# Patient Record
Sex: Male | Born: 1950 | Race: White | Hispanic: No | Marital: Married | Smoking: Never smoker
Health system: Southern US, Community
[De-identification: ages and names within clinical notes are randomized; demographics above are authoritative.]

## PROBLEM LIST (undated history)

## (undated) DIAGNOSIS — G4730 Sleep apnea, unspecified: Secondary | ICD-10-CM

## (undated) DIAGNOSIS — I10 Essential (primary) hypertension: Secondary | ICD-10-CM

## (undated) DIAGNOSIS — F32A Depression, unspecified: Secondary | ICD-10-CM

## (undated) DIAGNOSIS — I2510 Atherosclerotic heart disease of native coronary artery without angina pectoris: Secondary | ICD-10-CM

## (undated) HISTORY — PX: UVULOPALATOPHARYNGOPLASTY: SHX827

## (undated) HISTORY — DX: Depression, unspecified: F32.A

## (undated) HISTORY — DX: Essential (primary) hypertension: I10

## (undated) HISTORY — PX: OTHER SURGICAL HISTORY: SHX169

## (undated) HISTORY — DX: Atherosclerotic heart disease of native coronary artery without angina pectoris: I25.10

## (undated) HISTORY — PX: NASAL SEPTUM SURGERY: SHX37

## (undated) HISTORY — PX: NASAL POLYP SURGERY: SHX186

## (undated) HISTORY — DX: Sleep apnea, unspecified: G47.30

## (undated) HISTORY — PX: FINGER TENDON REPAIR: SHX1640

---

## 2001-03-03 ENCOUNTER — Emergency Department (HOSPITAL_COMMUNITY): Admission: EM | Admit: 2001-03-03 | Discharge: 2001-03-03 | Payer: Self-pay

## 2003-08-27 ENCOUNTER — Emergency Department (HOSPITAL_COMMUNITY): Admission: EM | Admit: 2003-08-27 | Discharge: 2003-08-28 | Payer: Self-pay | Admitting: Emergency Medicine

## 2012-12-24 ENCOUNTER — Encounter: Payer: Self-pay | Admitting: Gastroenterology

## 2014-03-19 ENCOUNTER — Encounter: Payer: Self-pay | Admitting: Gastroenterology

## 2020-12-07 ENCOUNTER — Encounter: Payer: Self-pay | Admitting: Cardiology

## 2020-12-07 ENCOUNTER — Ambulatory Visit: Payer: Medicare Other | Admitting: Cardiology

## 2020-12-07 ENCOUNTER — Other Ambulatory Visit: Payer: Self-pay

## 2020-12-07 VITALS — BP 142/78 | HR 80 | Temp 97.8°F | Resp 16 | Ht 73.0 in | Wt 219.0 lb

## 2020-12-07 DIAGNOSIS — I10 Essential (primary) hypertension: Secondary | ICD-10-CM

## 2020-12-07 DIAGNOSIS — R072 Precordial pain: Secondary | ICD-10-CM

## 2020-12-07 DIAGNOSIS — G4733 Obstructive sleep apnea (adult) (pediatric): Secondary | ICD-10-CM

## 2020-12-07 NOTE — Progress Notes (Signed)
Date:  12/07/2020   ID:  Richard Lindsey, DOB Mar 28, 1951, MRN [id number]  PCP:  Pieter Partridge, PA  Cardiologist:  Rex Kras, DO, [facility] (established care 12/07/2020)  REASON FOR CONSULT: Chest pain  REQUESTING PHYSICIAN:  Pieter Partridge, [address] [place],  [place] [address]  Chief Complaint  Patient presents with  . Chest Pain  . New Patient (Initial Visit)    HPI  Richard Lindsey is a 70 y.o. male who presents to the office with a chief complaint of " chest pain." Patient's past medical history and cardiovascular risk factors include: Hypertension, depression, OSA on CPAP, depression.  He is referred to the office at the request of Pieter Partridge, PA for evaluation of chest pain.  Patient states that he has had chest discomfort for a long time but it is present intermittently.  The discomfort in his located left anterior chest wall and the left axillary region.  Feels like a dull-like sensation, intensity is 2 out of 10, lasts for a few minutes, and self resolved.  The symptoms usually improve with resting and calming down and gets worse with getting upset or increased stress with day-to-day activities.  He has not noticed any change in physical endurance.  Symptoms are not brought on by effort related activities and does not resolve with rest.  Patient states that he is physically active and gets at least 8,000-9,000 steps per day but does not participate in any exercise program.  No change in physical endurance.  Dad had MI in mid 104s and another MI a year later.   FUNCTIONAL STATUS: Walks about 8,000-9,000 steps per day at work. Otherwise, no structured exercise program.  ALLERGIES: No Known Allergies  MEDICATION LIST PRIOR TO VISIT: Current Meds  Medication Sig  . amLODipine (NORVASC) 5 MG tablet Take 1 tablet by mouth daily.  Marland Kitchen azelastine (ASTELIN) 0.1 % nasal spray Place into the nose.  Marland Kitchen buPROPion (WELLBUTRIN XL) 300 MG 24 hr tablet Take 1 tablet by  mouth daily.  . hydrochlorothiazide (HYDRODIURIL) 25 MG tablet Take 1 tablet by mouth daily.     PAST MEDICAL HISTORY: Past Medical History:  Diagnosis Date  . Depression   . Hypertension   . Sleep apnea     PAST SURGICAL HISTORY: Past Surgical History:  Procedure Laterality Date  . FINGER TENDON REPAIR    . NASAL POLYP SURGERY    . NASAL SEPTUM SURGERY    . UVULOPALATOPHARYNGOPLASTY      FAMILY HISTORY: The patient family history includes Arthritis in his brother; Congestive Heart Failure in his father; Heart attack in his father; Lymphoma in his brother; Stroke in his mother.  SOCIAL HISTORY:  The patient  reports that he has never smoked. He has never used smokeless tobacco. He reports current alcohol use of about 2.0 standard drinks of alcohol per week. He reports that he does not use drugs.  REVIEW OF SYSTEMS: Review of Systems  Constitutional: Negative for chills and fever.  HENT: Negative for hoarse voice and nosebleeds.   Eyes: Negative for discharge, double vision and pain.  Cardiovascular: Positive for chest pain. Negative for claudication, dyspnea on exertion, leg swelling, near-syncope, orthopnea, palpitations, paroxysmal nocturnal dyspnea and syncope.  Respiratory: Negative for hemoptysis and shortness of breath.   Musculoskeletal: Negative for muscle cramps and myalgias.  Gastrointestinal: Negative for abdominal pain, constipation, diarrhea, hematemesis, hematochezia, melena, nausea and vomiting.  Neurological: Negative for dizziness and light-headedness.  PHYSICAL EXAM: Today's Vitals   12/07/20 0930 12/07/20 0940  BP: (!) 176/99 (!) 142/78  Pulse: 82 80  Resp: 16   Temp: 97.8 F (36.6 C)   TempSrc: Temporal   SpO2: 96%   Weight: 219 lb (99.3 kg)   Height: $Remove'6\' 1"'wItwGRc$  (1.854 m)    Body mass index is 28.89 kg/m. CONSTITUTIONAL: Well-developed and well-nourished. No acute distress.  SKIN: Skin is warm and dry. No rash noted. No cyanosis. No pallor. No  jaundice HEAD: Normocephalic and atraumatic.  EYES: No scleral icterus MOUTH/THROAT: Moist oral membranes.  NECK: No JVD present. No thyromegaly noted. No carotid bruits  LYMPHATIC: No visible cervical adenopathy.  CHEST Normal respiratory effort. No intercostal retractions  LUNGS: Clear to auscultation bilaterally.  No stridor. No wheezes. No rales.  CARDIOVASCULAR: Regular rate and rhythm, positive S1-S2, no murmurs rubs or gallops appreciated. ABDOMINAL: No apparent ascites.  EXTREMITIES: No peripheral edema  HEMATOLOGIC: No significant bruising NEUROLOGIC: Oriented to person, place, and time. Nonfocal. Normal muscle tone.  PSYCHIATRIC: Normal mood and affect. Normal behavior. Cooperative  CARDIAC DATABASE: EKG: 12/07/2020: Normal sinus rhythm, left axis deviation, left anterior fascicular block, without underlying injury pattern.   Echocardiogram: No results found for this or any previous visit from the past 1095 days.   Stress Testing: No results found for this or any previous visit from the past 1095 days.  Heart Catheterization: None  LABORATORY DATA: No flowsheet data found.  No flowsheet data found.  Lipid Panel  No results found for: CHOL, TRIG, HDL, CHOLHDL, VLDL, LDLCALC, LDLDIRECT, LABVLDL  No components found for: NTPROBNP No results for input(s): PROBNP in the last 8760 hours. No results for input(s): TSH in the last 8760 hours.  BMP No results for input(s): NA, K, CL, CO2, GLUCOSE, BUN, CREATININE, CALCIUM, GFRNONAA, GFRAA in the last 8760 hours.  HEMOGLOBIN A1C No results found for: HGBA1C, MPG  External Labs: Collected: 11/16/2020 provided by PCP. Sodium 136, potassium 3.4, chloride 98, bicarb 30, BUN 17 Creatinine 1.21 mg/dL. eGFR: >60 mL/min per 1.73 m Hemoglobin 15.5 g/dL. Hematocrit 46%  IMPRESSION:    ICD-10-CM   1. Precordial pain  R07.2 EKG 12-Lead    PCV CARDIAC STRESS TEST    PCV ECHOCARDIOGRAM COMPLETE    CT CARDIAC SCORING (SELF  PAY ONLY)    Lipid Panel With LDL/HDL Ratio    LDL cholesterol, direct  2. Benign hypertension  I10   3. OSA on CPAP  G47.33    Z99.89      RECOMMENDATIONS: Richard Lindsey is a 70 y.o. male whose past medical history and cardiac risk factors include: Hypertension, depression, OSA on CPAP, depression.  Precordial pain: Most likely noncardiac in etiology. However, due to multiple cardiovascular risk factors that shared decision was to proceed with an ischemic evaluation. Schedule exercise treadmill stress test to evaluate for functional status and exercises induced ischemia Echocardiogram will be ordered to evaluate for structural heart disease and left ventricular systolic function. Coronary artery calcium score for further risk stratification Will check fasting lipid profile.  Benign essential hypertension: Blood pressure within acceptable range but not at goal. Currently working with PCP for medication titration. Patient states that he was recently started on Norvasc. Low-salt diet recommended.  Obstructive sleep apnea: Uses CPAP daily.   FINAL MEDICATION LIST END OF ENCOUNTER: No orders of the defined types were placed in this encounter.   There are no discontinued medications.   Current Outpatient Medications:  .  amLODipine (NORVASC) 5  MG tablet, Take 1 tablet by mouth daily., Disp: , Rfl:  .  azelastine (ASTELIN) 0.1 % nasal spray, Place into the nose., Disp: , Rfl:  .  buPROPion (WELLBUTRIN XL) 300 MG 24 hr tablet, Take 1 tablet by mouth daily., Disp: , Rfl:  .  hydrochlorothiazide (HYDRODIURIL) 25 MG tablet, Take 1 tablet by mouth daily., Disp: , Rfl:   Orders Placed This Encounter  Procedures  . CT CARDIAC SCORING (SELF PAY ONLY)  . Lipid Panel With LDL/HDL Ratio  . LDL cholesterol, direct  . PCV CARDIAC STRESS TEST  . EKG 12-Lead  . PCV ECHOCARDIOGRAM COMPLETE    There are no Patient Instructions on file for this visit.   --Continue cardiac medications as  reconciled in final medication list. --Return in about 6 weeks (around 01/18/2021) for Follow up, Chest pain, Review test results. Or sooner if needed. --Continue follow-up with your primary care physician regarding the management of your other chronic comorbid conditions.  Patient's questions and concerns were addressed to his satisfaction. He voices understanding of the instructions provided during this encounter.   This note was created using a voice recognition software as a result there may be grammatical errors inadvertently enclosed that do not reflect the nature of this encounter. Every attempt is made to correct such errors.  Rex Kras, [place], [facility]  Pager: [phone] Office: [phone]

## 2020-12-15 ENCOUNTER — Other Ambulatory Visit (HOSPITAL_COMMUNITY)
Admission: RE | Admit: 2020-12-15 | Discharge: 2020-12-15 | Disposition: A | Discharge status: Home / Self Care | Payer: Medicare Other | Source: Ambulatory Visit | Attending: Cardiology | Admitting: Cardiology

## 2020-12-15 DIAGNOSIS — Z01812 Encounter for preprocedural laboratory examination: Secondary | ICD-10-CM | POA: Insufficient documentation

## 2020-12-15 DIAGNOSIS — Z20822 Contact with and (suspected) exposure to covid-19: Secondary | ICD-10-CM | POA: Insufficient documentation

## 2020-12-15 LAB — SARS CORONAVIRUS 2 (TAT 6-24 HRS): SARS Coronavirus 2: NEGATIVE

## 2020-12-17 ENCOUNTER — Other Ambulatory Visit: Payer: Self-pay

## 2020-12-17 ENCOUNTER — Ambulatory Visit: Payer: Medicare Other

## 2020-12-17 DIAGNOSIS — R072 Precordial pain: Secondary | ICD-10-CM

## 2020-12-20 ENCOUNTER — Ambulatory Visit
Admission: RE | Admit: 2020-12-20 | Discharge: 2020-12-20 | Disposition: A | Discharge status: Home / Self Care | Payer: No Typology Code available for payment source | Source: Ambulatory Visit | Attending: Cardiology | Admitting: Cardiology

## 2020-12-20 ENCOUNTER — Ambulatory Visit: Payer: Medicare Other

## 2020-12-20 ENCOUNTER — Other Ambulatory Visit: Payer: Self-pay

## 2020-12-20 DIAGNOSIS — R072 Precordial pain: Secondary | ICD-10-CM

## 2020-12-28 NOTE — Telephone Encounter (Signed)
Please reach out to radiology and have them amend the report and findings.

## 2020-12-28 NOTE — Telephone Encounter (Signed)
Please follow up in 48hr to make sure the changes have been made.

## 2021-01-19 LAB — LIPID PANEL WITH LDL/HDL RATIO
Cholesterol, Total: 180 mg/dL (ref 100–199)
HDL: 58 mg/dL (ref >39)
LDL Chol Calc (NIH): 113 mg/dL — ABNORMAL HIGH (ref 0–99)
LDL/HDL Ratio: 1.9 ratio (ref 0.0–3.6)
Triglycerides: 48 mg/dL (ref 0–149)
VLDL Cholesterol Cal: 9 mg/dL (ref 5–40)

## 2021-01-19 LAB — LDL CHOLESTEROL, DIRECT: LDL Direct: 113 mg/dL — ABNORMAL HIGH (ref 0–99)

## 2021-01-25 ENCOUNTER — Encounter: Payer: Self-pay | Admitting: Cardiology

## 2021-01-25 ENCOUNTER — Other Ambulatory Visit: Payer: Self-pay

## 2021-01-25 ENCOUNTER — Ambulatory Visit: Payer: Medicare Other | Admitting: Cardiology

## 2021-01-25 VITALS — BP 144/81 | HR 81 | Temp 97.7°F | Resp 16 | Ht 73.0 in | Wt 220.0 lb

## 2021-01-25 DIAGNOSIS — I2510 Atherosclerotic heart disease of native coronary artery without angina pectoris: Secondary | ICD-10-CM

## 2021-01-25 DIAGNOSIS — G4733 Obstructive sleep apnea (adult) (pediatric): Secondary | ICD-10-CM

## 2021-01-25 DIAGNOSIS — R072 Precordial pain: Secondary | ICD-10-CM

## 2021-01-25 DIAGNOSIS — I10 Essential (primary) hypertension: Secondary | ICD-10-CM

## 2021-01-25 MED ORDER — ROSUVASTATIN CALCIUM 5 MG PO TABS: 5.0000 mg | ORAL_TABLET | Freq: Every day | ORAL | 0 refills | Status: DC

## 2021-01-25 MED ORDER — ASPIRIN EC 81 MG PO TBEC: 81.0000 mg | DELAYED_RELEASE_TABLET | Freq: Every day | ORAL | 3 refills | Status: DC

## 2021-01-25 NOTE — Progress Notes (Signed)
Date:  01/25/2021   ID:  Richard Lindsey, DOB 20-Jan-1951, MRN [id number]  PCP:  Pieter Partridge, PA  Cardiologist:  Rex Kras, DO, [facility] (established care 12/07/2020)  Date: 01/25/21 Last Office Visit: 12/07/2020  Chief Complaint  Patient presents with  . Precordial pain  . Results  . Follow-up    HPI  Richard Lindsey is a 70 y.o. male who presents to the office with a chief complaint of " re-evaluation of chest pain and review test results." Patient's past medical history and cardiovascular risk factors include: Mild coronary artery calcification, Hypertension, depression, OSA on CPAP, depression.  He is referred to the office at the request of Pieter Partridge, PA for evaluation of chest pain.  Since the last office visit he has been asymptomatic with regards to chest pain or anginal equivalent.   Reviewed the results of echo, calcium score, and exercise treadmill stress test with him at today's visit and noted below for reference.   He continues to walk atleast  8,000-9,000 steps per day and no change in physical endurance.  Dad had MI in mid 60s and another MI a year later.   FUNCTIONAL STATUS: Walks about 8,000-9,000 steps per day at work. Otherwise, no structured exercise program.  ALLERGIES: No Known Allergies  MEDICATION LIST PRIOR TO VISIT: Current Meds  Medication Sig  . amLODipine (NORVASC) 5 MG tablet Take 1 tablet by mouth daily.  Marland Kitchen aspirin EC 81 MG tablet Take 1 tablet (81 mg total) by mouth daily. Swallow whole.  Marland Kitchen azelastine (ASTELIN) 0.1 % nasal spray Place into the nose.  Marland Kitchen buPROPion (WELLBUTRIN XL) 300 MG 24 hr tablet Take 1 tablet by mouth daily.  . hydrochlorothiazide (HYDRODIURIL) 25 MG tablet Take 1 tablet by mouth daily.  . rosuvastatin (CRESTOR) 5 MG tablet Take 1 tablet (5 mg total) by mouth at bedtime.     PAST MEDICAL HISTORY: Past Medical History:  Diagnosis Date  . Depression   . Hypertension   . Sleep apnea     PAST SURGICAL  HISTORY: Past Surgical History:  Procedure Laterality Date  . FINGER TENDON REPAIR    . NASAL POLYP SURGERY    . NASAL SEPTUM SURGERY    . UVULOPALATOPHARYNGOPLASTY      FAMILY HISTORY: The patient family history includes Arthritis in his brother; Congestive Heart Failure in his father; Heart attack in his father; Lymphoma in his brother; Stroke in his mother.  SOCIAL HISTORY:  The patient  reports that he has never smoked. He has never used smokeless tobacco. He reports current alcohol use of about 2.0 standard drinks of alcohol per week. He reports that he does not use drugs.  REVIEW OF SYSTEMS: Review of Systems  Constitutional: Negative for chills and fever.  HENT: Negative for hoarse voice and nosebleeds.   Eyes: Negative for discharge, double vision and pain.  Cardiovascular: Negative for chest pain, claudication, dyspnea on exertion, leg swelling, near-syncope, orthopnea, palpitations, paroxysmal nocturnal dyspnea and syncope.  Respiratory: Negative for hemoptysis and shortness of breath.   Musculoskeletal: Negative for muscle cramps and myalgias.  Gastrointestinal: Negative for abdominal pain, constipation, diarrhea, hematemesis, hematochezia, melena, nausea and vomiting.  Neurological: Negative for dizziness and light-headedness.    PHYSICAL EXAM: Today's Vitals   01/25/21 1030  BP: (!) 144/81  Pulse: 81  Resp: 16  Temp: 97.7 F (36.5 C)  TempSrc: Temporal  SpO2: 98%  Weight: 220 lb (99.8 kg)  Height: 6' 1"  (1.854 m)  Body mass index is 29.03 kg/m. CONSTITUTIONAL: Well-developed and well-nourished. No acute distress.  SKIN: Skin is warm and dry. No rash noted. No cyanosis. No pallor. No jaundice HEAD: Normocephalic and atraumatic.  EYES: No scleral icterus MOUTH/THROAT: Moist oral membranes.  NECK: No JVD present. No thyromegaly noted. No carotid bruits  LYMPHATIC: No visible cervical adenopathy.  CHEST Normal respiratory effort. No intercostal retractions   LUNGS: Clear to auscultation bilaterally.  No stridor. No wheezes. No rales.  CARDIOVASCULAR: Regular rate and rhythm, positive S1-S2, no murmurs rubs or gallops appreciated. ABDOMINAL: No apparent ascites.  EXTREMITIES: No peripheral edema  HEMATOLOGIC: No significant bruising NEUROLOGIC: Oriented to person, place, and time. Nonfocal. Normal muscle tone.  PSYCHIATRIC: Normal mood and affect. Normal behavior. Cooperative  CARDIAC DATABASE: EKG: 12/07/2020: Normal sinus rhythm, left axis deviation, left anterior fascicular block, without underlying injury pattern.   Echocardiogram: 12/20/2020: Normal LV systolic function with visual EF 60-65%. Left ventricle cavity is normal in size. Moderate left ventricular hypertrophy. Normal global wall motion. Normal diastolic filling pattern, normal LAP. Trace aortic regurgitation. Trace tricuspid regurgitation. No evidence of pulmonary hypertension. No prior study for comparison   Stress Testing: Exercise treadmill stress test 12/17/2020: Exercise treadmill stress test performed using Bruce protocol. Patient reached 10.1 METS, and 105% of age predicted maximum heart rate. Exercise capacity was excellent. No chest pain reported. Normal heart rate and hemodynamic response. Stress EKG revealed no ischemic changes. Low risk study.  Heart Catheterization: None  Coronary artery calcium scoring 12/2020: 1. LAD and LEFT circumflex coronary artery calcification. 2. Total Agatston Score: 105 3. MESA age and sex matched database percentile: 31  LABORATORY DATA: No flowsheet data found.  No flowsheet data found.  Lipid Panel     Component Value Date/Time   CHOL 180 01/18/2021 0842   TRIG 48 01/18/2021 0842   HDL 58 01/18/2021 0842   LDLCALC 113 (H) 01/18/2021 0842   LDLDIRECT 113 (H) 01/18/2021 0842   LABVLDL 9 01/18/2021 0842    No components found for: NTPROBNP No results for input(s): PROBNP in the last 8760 hours. No results for  input(s): TSH in the last 8760 hours.  BMP No results for input(s): NA, K, CL, CO2, GLUCOSE, BUN, CREATININE, CALCIUM, GFRNONAA, GFRAA in the last 8760 hours.  HEMOGLOBIN A1C No results found for: HGBA1C, MPG  External Labs: Collected: 11/16/2020 provided by PCP. Sodium 136, potassium 3.4, chloride 98, bicarb 30, BUN 17 Creatinine 1.21 mg/dL. eGFR: >60 mL/min per 1.73 m Hemoglobin 15.5 g/dL. Hematocrit 46%  IMPRESSION:    ICD-10-CM   1. Precordial pain  R07.2   2. Benign hypertension  I10   3. OSA on CPAP  G47.33    Z99.89   4. Coronary atherosclerosis due to calcified coronary lesion  I25.10 aspirin EC 81 MG tablet   I25.84 rosuvastatin (CRESTOR) 5 MG tablet    Lipid Panel With LDL/HDL Ratio    LDL cholesterol, direct    CMP14+EGFR     RECOMMENDATIONS: LANDYN BUCKALEW is a 70 y.o. male whose past medical history and cardiac risk factors include: Hypertension, depression, OSA on CPAP, depression.  Precordial pain: Resolved  Reviewed the results of echo, stress test, and calcium score with the patient.  Given the mild CAC noted in both LAD and LCX recommended statin and ASA 81 mg po qday. He is agreeable.   Coronary atherosclerosis due to calcified coronary lesion:  Start Crestor 5 mg po qhs  Start ASA 79m po qday Will check lipids  and CMP in 6 weeks to re-evaluate therapy.   Benign essential hypertension: Office blood pressure not at goal but working with PCP for medication titration. Patient states that he was recently started on Norvasc - may consider uptitration if needed.  Educated to keep a BP log.  Low-salt diet recommended.  Obstructive sleep apnea: Uses CPAP daily.  As long he tolerates the medications well and repeat blood work in 6 weeks is stable will see him in follow up in 6 months or sooner if needed. He is thankful for the following and care provided.   FINAL MEDICATION LIST END OF ENCOUNTER: Meds ordered this encounter  Medications  . aspirin EC  81 MG tablet    Sig: Take 1 tablet (81 mg total) by mouth daily. Swallow whole.    Dispense:  90 tablet    Refill:  3  . rosuvastatin (CRESTOR) 5 MG tablet    Sig: Take 1 tablet (5 mg total) by mouth at bedtime.    Dispense:  90 tablet    Refill:  0    There are no discontinued medications.   Current Outpatient Medications:  .  amLODipine (NORVASC) 5 MG tablet, Take 1 tablet by mouth daily., Disp: , Rfl:  .  aspirin EC 81 MG tablet, Take 1 tablet (81 mg total) by mouth daily. Swallow whole., Disp: 90 tablet, Rfl: 3 .  azelastine (ASTELIN) 0.1 % nasal spray, Place into the nose., Disp: , Rfl:  .  buPROPion (WELLBUTRIN XL) 300 MG 24 hr tablet, Take 1 tablet by mouth daily., Disp: , Rfl:  .  hydrochlorothiazide (HYDRODIURIL) 25 MG tablet, Take 1 tablet by mouth daily., Disp: , Rfl:  .  rosuvastatin (CRESTOR) 5 MG tablet, Take 1 tablet (5 mg total) by mouth at bedtime., Disp: 90 tablet, Rfl: 0  Orders Placed This Encounter  Procedures  . Lipid Panel With LDL/HDL Ratio  . LDL cholesterol, direct  . CMP14+EGFR    There are no Patient Instructions on file for this visit.   --Continue cardiac medications as reconciled in final medication list. --Return in about 6 months (around 07/27/2021) for Follow up, Coronary artery calcification. Or sooner if needed. --Continue follow-up with your primary care physician regarding the management of your other chronic comorbid conditions.  Patient's questions and concerns were addressed to his satisfaction. He voices understanding of the instructions provided during this encounter.   This note was created using a voice recognition software as a result there may be grammatical errors inadvertently enclosed that do not reflect the nature of this encounter. Every attempt is made to correct such errors.  Rex Kras, [place], [facility]  Pager: [phone] Office: [phone]

## 2021-02-03 ENCOUNTER — Other Ambulatory Visit: Payer: Self-pay

## 2021-02-03 DIAGNOSIS — I2510 Atherosclerotic heart disease of native coronary artery without angina pectoris: Secondary | ICD-10-CM

## 2021-03-19 LAB — CMP14+EGFR
ALT: 27 IU/L (ref 0–44)
AST: 23 IU/L (ref 0–40)
Albumin/Globulin Ratio: 1.8 (ref 1.2–2.2)
Albumin: 4.5 g/dL (ref 3.8–4.8)
Alkaline Phosphatase: 79 IU/L (ref 44–121)
BUN/Creatinine Ratio: 16 (ref 10–24)
BUN: 17 mg/dL (ref 8–27)
Bilirubin Total: 0.9 mg/dL (ref 0.0–1.2)
CO2: 24 mmol/L (ref 20–29)
Calcium: 9 mg/dL (ref 8.6–10.2)
Chloride: 100 mmol/L (ref 96–106)
Creatinine, Ser: 1.09 mg/dL (ref 0.76–1.27)
Globulin, Total: 2.5 g/dL (ref 1.5–4.5)
Glucose: 95 mg/dL (ref 65–99)
Potassium: 3.8 mmol/L (ref 3.5–5.2)
Sodium: 140 mmol/L (ref 134–144)
Total Protein: 7 g/dL (ref 6.0–8.5)
eGFR: 74 mL/min/{1.73_m2} (ref >59)

## 2021-03-19 LAB — LIPID PANEL WITH LDL/HDL RATIO
Cholesterol, Total: 140 mg/dL (ref 100–199)
HDL: 58 mg/dL (ref >39)
LDL Chol Calc (NIH): 68 mg/dL (ref 0–99)
LDL/HDL Ratio: 1.2 ratio (ref 0.0–3.6)
Triglycerides: 73 mg/dL (ref 0–149)
VLDL Cholesterol Cal: 14 mg/dL (ref 5–40)

## 2021-03-19 LAB — LDL CHOLESTEROL, DIRECT: LDL Direct: 67 mg/dL (ref 0–99)

## 2021-04-22 ENCOUNTER — Other Ambulatory Visit: Payer: Self-pay | Admitting: Cardiology

## 2021-04-22 DIAGNOSIS — I2510 Atherosclerotic heart disease of native coronary artery without angina pectoris: Secondary | ICD-10-CM

## 2021-04-22 DIAGNOSIS — I2584 Coronary atherosclerosis due to calcified coronary lesion: Secondary | ICD-10-CM

## 2021-05-19 IMAGING — CT CT CARDIAC CORONARY ARTERY CALCIUM SCORE
3 series · 13 of 20 positions shown, 15 images · non-contrast
Comparison: None.
COMPARISON: None.

Addendum:
CLINICAL DATA: 79-year-old white male. Family history heart disease

EXAM:
CT CARDIAC CORONARY ARTERY CALCIUM SCORE
TECHNIQUE: Non-contrast imaging through the heart was performed using
prospective ECG gating. Image post processing was performed on an
independent workstation, allowing for quantitative analysis of the
heart and coronary arteries. Note that this exam targets the heart
and the chest was not imaged in its entirety.
CLINICAL DATA: 69-year-old.  Impression unchanged.
*** End of Addendum ***

[Series 2: calcium scoring 2.00 qr36 bestdiast 71% hrt calciu · axial · 0.45mm/px · z∈[+1467,+1531]mm · 3 of 80 slices shown]
[im 16/80  vessel]
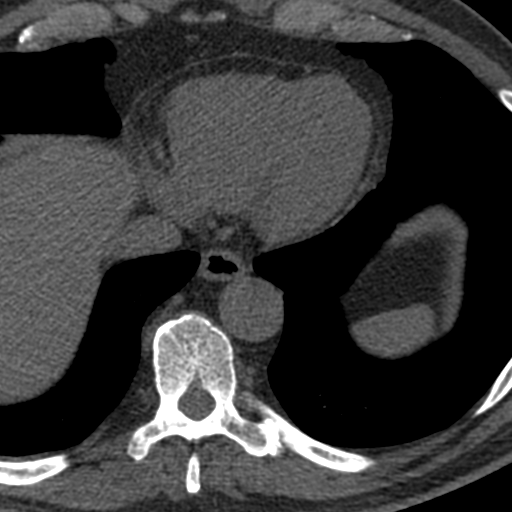
[im 32/80  vessel]
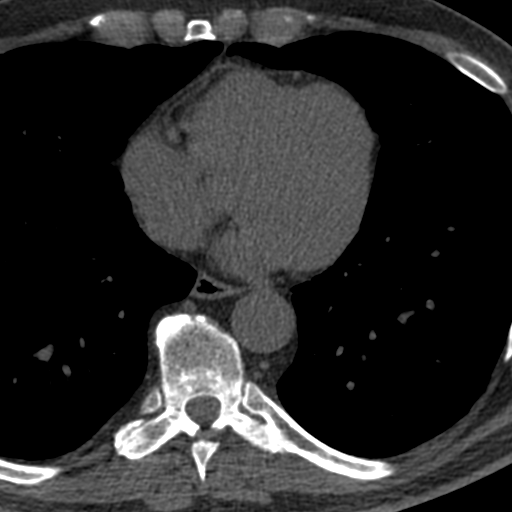
[im 48/80  vessel]
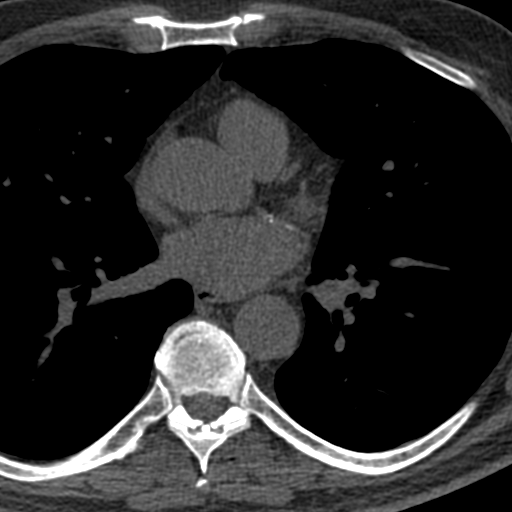

[Series 3: calcium scoring 2.00 br40 bestdiast 71% axial · axial · 0.55mm/px · z∈[+1463,+1567]mm · 5 of 80 slices shown, 7 images]
[im 14/80  vessel]
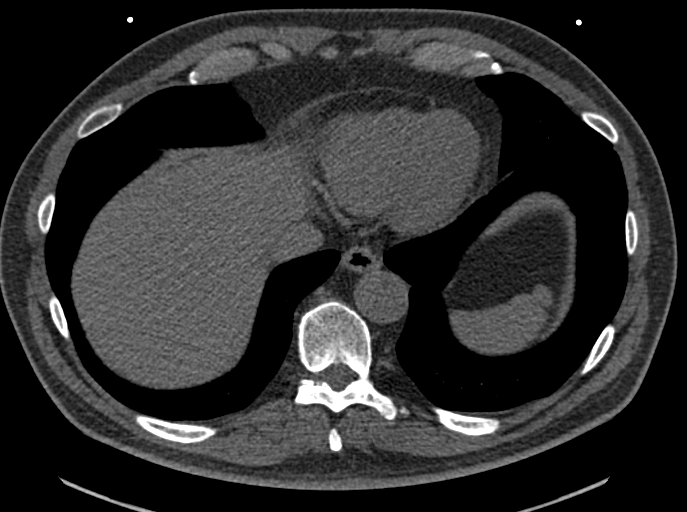
[im 14/80  lung]
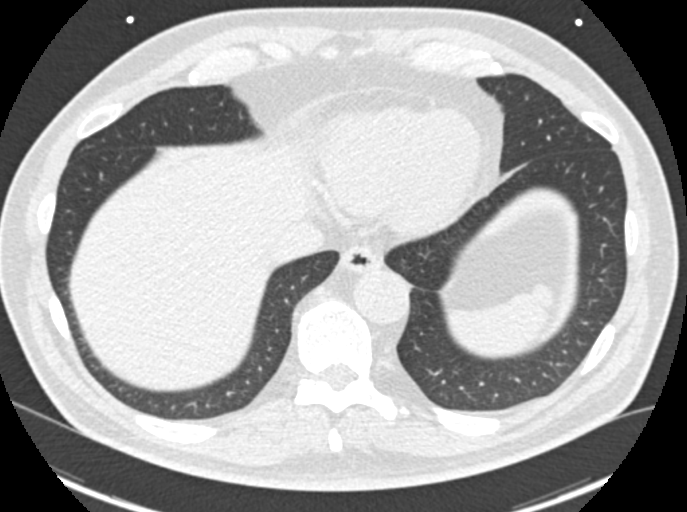
[im 27/80  vessel]
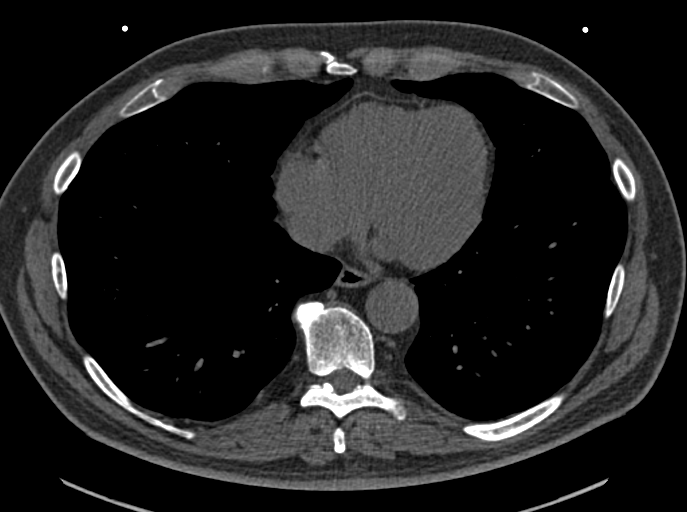
[im 40/80  vessel]
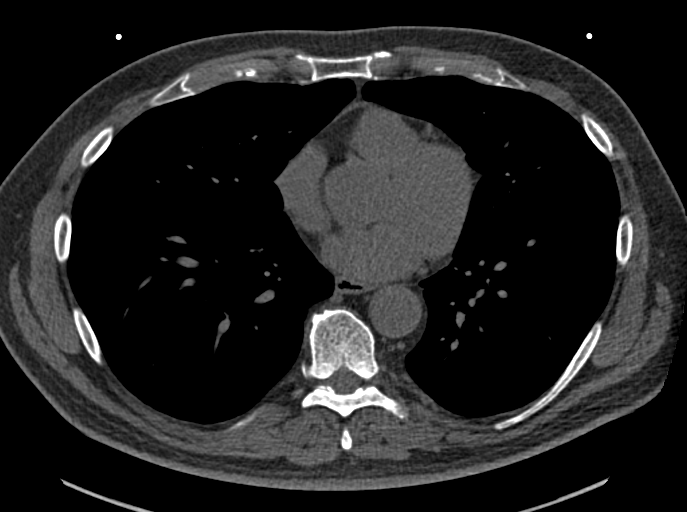
[im 53/80  vessel]
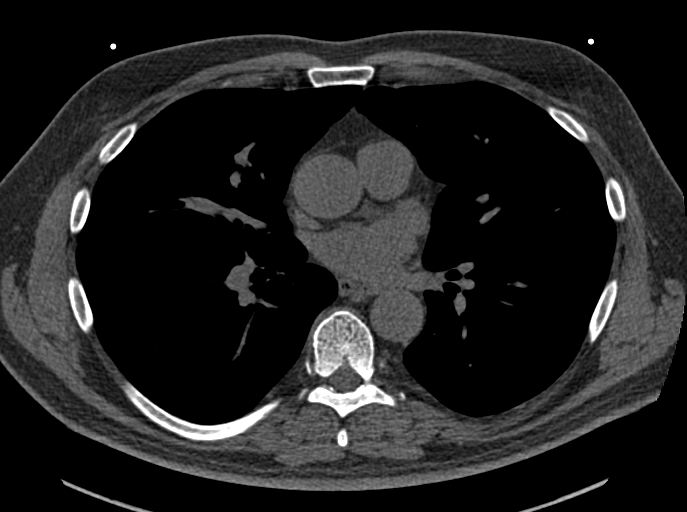
[im 66/80  vessel]
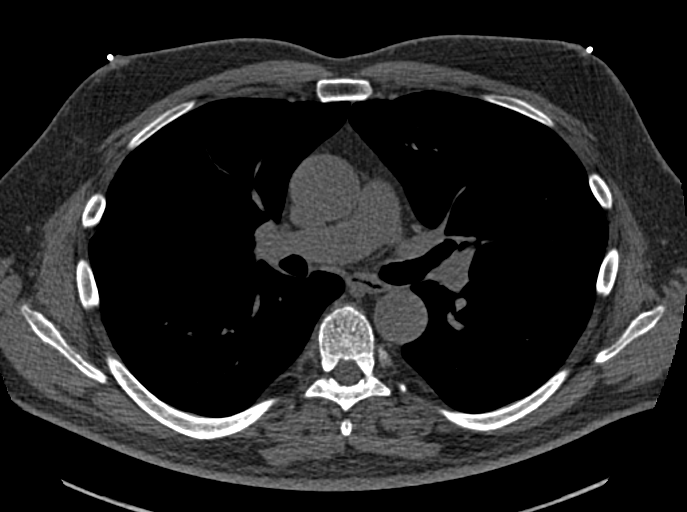
[im 66/80  lung]
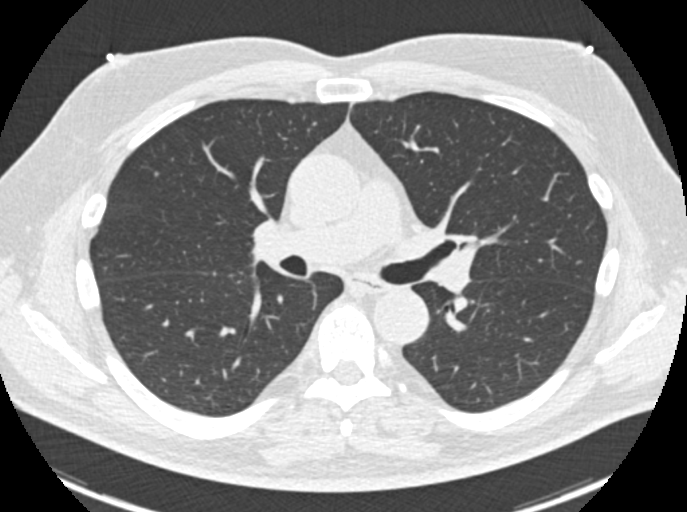

[Series 9: calcium scoring 2.00 br60 bestdiast 71% lungs · axial · 0.60mm/px · z∈[+1463,+1567]mm · 5 of 80 slices shown]
[im 14/80  vessel]
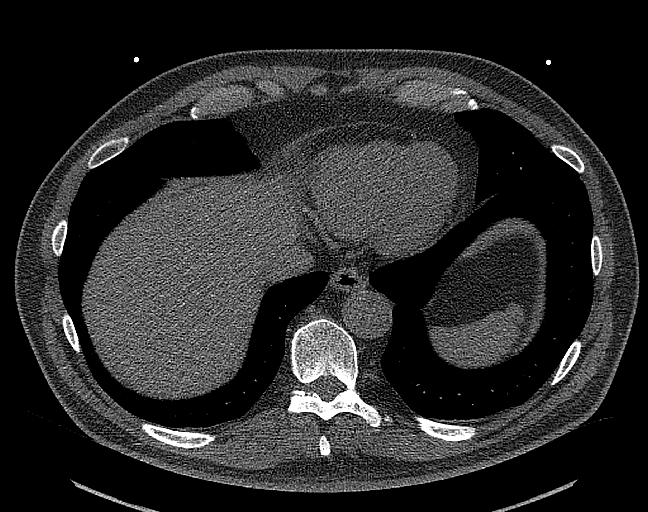
[im 27/80  vessel]
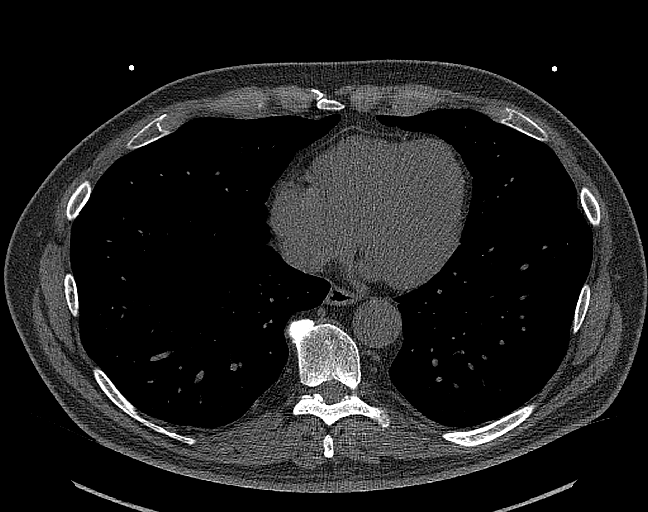
[im 40/80  vessel]
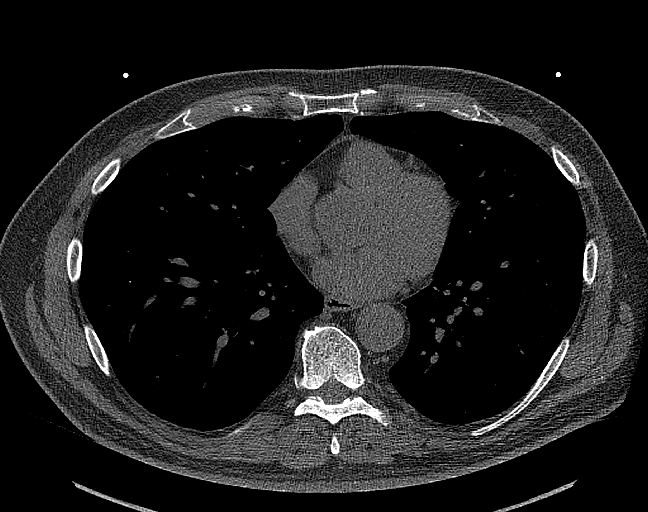
[im 53/80  vessel]
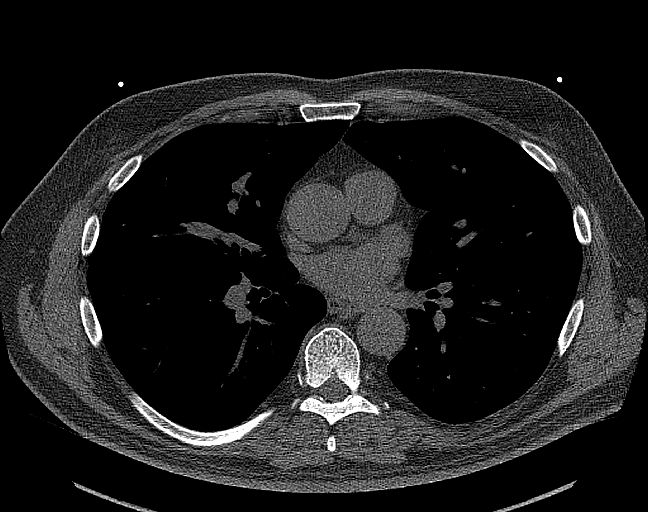
[im 66/80  vessel]
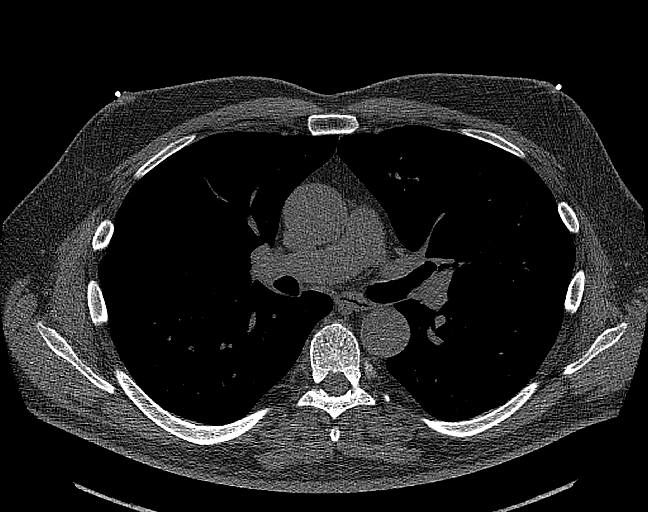

[13 of 20 positions shown; findings below may reference images not displayed]

FINDINGS: Technical quality: Good

CORONARY CALCIUM SCORES:

Left Main: No coronary artery calcification

LAD: 79

LCx: 26

RCA: No coronary calcification

CORONARY CALCIUM

Total Agatston Score: 105

[HOSPITAL] percentile: 48

Ascending aorta (normal <  40 mm): 34 mm

EXTRACARDIAC FINDINGS:

Limited view of the lung parenchyma demonstrates no suspicious
nodularity. Airways are normal.

Limited view of the mediastinum demonstrates no adenopathy.
Esophagus normal.

Limited view of the upper abdomen is unremarkable.

Limited view of the skeleton and chest wall is unremarkable.
IMPRESSION: 1. LAD and LEFT circumflex coronary artery calcification.

2. Total Agatston Score: 105

3. MESA age and sex matched database percentile: 48

ADDENDUM:
Voice recognition error:
FINDINGS: Technical quality: Good

CORONARY CALCIUM SCORES:

Left Main: No coronary artery calcification

LAD: 79

LCx: 26

RCA: No coronary calcification

CORONARY CALCIUM

Total Agatston Score: 105

[HOSPITAL] percentile: 48

Ascending aorta (normal <  40 mm): 34 mm

EXTRACARDIAC FINDINGS:

Limited view of the lung parenchyma demonstrates no suspicious
nodularity. Airways are normal.

Limited view of the mediastinum demonstrates no adenopathy.
Esophagus normal.

Limited view of the upper abdomen is unremarkable.

Limited view of the skeleton and chest wall is unremarkable.
IMPRESSION: 1. LAD and LEFT circumflex coronary artery calcification.

2. Total Agatston Score: 105

3. MESA age and sex matched database percentile: 48

## 2021-06-22 ENCOUNTER — Other Ambulatory Visit (HOSPITAL_COMMUNITY): Payer: Self-pay | Admitting: Family Medicine

## 2021-06-22 ENCOUNTER — Other Ambulatory Visit: Payer: Self-pay

## 2021-06-22 ENCOUNTER — Ambulatory Visit (HOSPITAL_COMMUNITY)
Admission: RE | Admit: 2021-06-22 | Discharge: 2021-06-22 | Disposition: A | Discharge status: Home / Self Care | Payer: Medicare Other | Source: Ambulatory Visit | Attending: Cardiology | Admitting: Cardiology

## 2021-06-22 DIAGNOSIS — M7989 Other specified soft tissue disorders: Secondary | ICD-10-CM | POA: Insufficient documentation

## 2021-06-22 DIAGNOSIS — M79605 Pain in left leg: Secondary | ICD-10-CM | POA: Diagnosis not present

## 2021-07-25 ENCOUNTER — Other Ambulatory Visit: Payer: Self-pay | Admitting: Cardiology

## 2021-07-26 LAB — CMP14+EGFR
ALT: 26 IU/L (ref 0–44)
AST: 22 IU/L (ref 0–40)
Albumin/Globulin Ratio: 1.8 (ref 1.2–2.2)
Albumin: 4.6 g/dL (ref 3.8–4.8)
Alkaline Phosphatase: 88 IU/L (ref 44–121)
BUN/Creatinine Ratio: 19 (ref 10–24)
BUN: 20 mg/dL (ref 8–27)
Bilirubin Total: 0.6 mg/dL (ref 0.0–1.2)
CO2: 26 mmol/L (ref 20–29)
Calcium: 9.5 mg/dL (ref 8.6–10.2)
Chloride: 100 mmol/L (ref 96–106)
Creatinine, Ser: 1.08 mg/dL (ref 0.76–1.27)
Globulin, Total: 2.6 g/dL (ref 1.5–4.5)
Glucose: 114 mg/dL — ABNORMAL HIGH (ref 70–99)
Potassium: 3.7 mmol/L (ref 3.5–5.2)
Sodium: 139 mmol/L (ref 134–144)
Total Protein: 7.2 g/dL (ref 6.0–8.5)
eGFR: 74 mL/min/{1.73_m2} (ref >59)

## 2021-07-26 LAB — LIPID PANEL WITH LDL/HDL RATIO
Cholesterol, Total: 136 mg/dL (ref 100–199)
HDL: 64 mg/dL (ref >39)
LDL Chol Calc (NIH): 55 mg/dL (ref 0–99)
LDL/HDL Ratio: 0.9 ratio (ref 0.0–3.6)
Triglycerides: 88 mg/dL (ref 0–149)
VLDL Cholesterol Cal: 17 mg/dL (ref 5–40)

## 2021-07-26 LAB — LDL CHOLESTEROL, DIRECT: LDL Direct: 56 mg/dL (ref 0–99)

## 2021-07-27 ENCOUNTER — Encounter: Payer: Self-pay | Admitting: Cardiology

## 2021-07-27 ENCOUNTER — Ambulatory Visit: Payer: Medicare Other | Admitting: Cardiology

## 2021-07-27 ENCOUNTER — Other Ambulatory Visit: Payer: Self-pay

## 2021-07-27 VITALS — BP 165/85 | HR 71 | Resp 16 | Ht 73.0 in | Wt 222.0 lb

## 2021-07-27 DIAGNOSIS — I2510 Atherosclerotic heart disease of native coronary artery without angina pectoris: Secondary | ICD-10-CM

## 2021-07-27 DIAGNOSIS — I10 Essential (primary) hypertension: Secondary | ICD-10-CM

## 2021-07-27 DIAGNOSIS — G4733 Obstructive sleep apnea (adult) (pediatric): Secondary | ICD-10-CM

## 2021-07-27 NOTE — Progress Notes (Signed)
Date:  01/25/2021   ID:  Richard Lindsey, DOB 1950-09-26, MRN [id number]  PCP:  Pieter Partridge, PA  Cardiologist:  Rex Kras, DO, [facility] (established care 12/07/2020)  Date: 07/27/21 Last Office Visit: 01/25/2021   Chief Complaint  Patient presents with   Coronary artery calcification   Follow-up    HPI  Richard Lindsey is a 70 y.o. male who presents to the office with a chief complaint of " 6 month follow up coronary calcium." Patient's past medical history and cardiovascular risk factors include: Mild coronary artery calcification, Hypertension, depression, OSA on CPAP, depression.  He is referred to the office at the request of Pieter Partridge, PA for evaluation of chest pain.  Patient presents for 29-monthfollow-up visit after recently being diagnosed with coronary artery calcification and started on pharmacological therapy at the last office visit.  Since last office visit he is doing well from a cardiovascular standpoint.  He denies any chest pain or shortness of breath at rest or with effort related activities.  He currently has a brace over his left And had an MRI earlier this morning.  This is reduced his overall functional capacity in the recent past.  His blood pressure today's office visit is elevated likely secondary to pain.  He denies any hospitalizations or urgent care visits for cardiovascular symptoms his last office encounter.  He denies any anginal discomfort or heart failure symptoms.  Recently had labs 07/25/2021 independently reviewed.  LDL is well controlled.  AST and ALT remain within normal limits.  He was started on aspirin and Crestor 5 mg p.o. nightly at last office visit which he tolerated well and is not having side effects or intolerances.  FUNCTIONAL STATUS: Walks about 8,000-9,000 steps per day at work. Otherwise, no structured exercise program.  ALLERGIES: No Known Allergies  MEDICATION LIST PRIOR TO VISIT: Current Meds  Medication Sig    acetaminophen (TYLENOL) 650 MG CR tablet Take 650 mg by mouth every 8 (eight) hours as needed for pain.   amLODipine (NORVASC) 5 MG tablet Take 1 tablet by mouth daily.   aspirin EC 81 MG tablet Take 1 tablet (81 mg total) by mouth daily. Swallow whole.   azelastine (ASTELIN) 0.1 % nasal spray Place into the nose.   buPROPion (WELLBUTRIN XL) 300 MG 24 hr tablet Take 1 tablet by mouth daily.   hydrochlorothiazide (HYDRODIURIL) 25 MG tablet Take 1 tablet by mouth daily.   rosuvastatin (CRESTOR) 5 MG tablet TAKE 1 TABLET BY MOUTH EVERYDAY AT BEDTIME     PAST MEDICAL HISTORY: Past Medical History:  Diagnosis Date   Depression    Hypertension    Sleep apnea     PAST SURGICAL HISTORY: Past Surgical History:  Procedure Laterality Date   FINGER TENDON REPAIR     NASAL POLYP SURGERY     NASAL SEPTUM SURGERY     UVULOPALATOPHARYNGOPLASTY      FAMILY HISTORY: The patient family history includes Arthritis in his brother; Congestive Heart Failure in his father; Heart attack in his father; Lymphoma in his brother; Stroke in his mother.  SOCIAL HISTORY:  The patient  reports that he has never smoked. He has never used smokeless tobacco. He reports current alcohol use of about 2.0 standard drinks per week. He reports that he does not use drugs.  REVIEW OF SYSTEMS: Review of Systems  Constitutional: Negative for chills and fever.  HENT:  Negative for hoarse voice and nosebleeds.   Eyes:  Negative  for discharge, double vision and pain.  Cardiovascular:  Negative for chest pain, claudication, dyspnea on exertion, leg swelling, near-syncope, orthopnea, palpitations, paroxysmal nocturnal dyspnea and syncope.  Respiratory:  Negative for hemoptysis and shortness of breath.   Musculoskeletal:  Negative for muscle cramps and myalgias.  Gastrointestinal:  Negative for abdominal pain, constipation, diarrhea, hematemesis, hematochezia, melena, nausea and vomiting.  Neurological:  Negative for dizziness  and light-headedness.   PHYSICAL EXAM: Today's Vitals   07/27/21 1004  BP: (!) 165/85  Pulse: 71  Resp: 16  SpO2: 97%  Weight: 222 lb (100.7 kg)  Height: _0  (1.854 m)   Body mass index is 29.29 kg/m. CONSTITUTIONAL: Well-developed and well-nourished. No acute distress.  SKIN: Skin is warm and dry. No rash noted. No cyanosis. No pallor. No jaundice HEAD: Normocephalic and atraumatic.  EYES: No scleral icterus MOUTH/THROAT: Moist oral membranes.  NECK: No JVD present. No thyromegaly noted. No carotid bruits  LYMPHATIC: No visible cervical adenopathy.  CHEST Normal respiratory effort. No intercostal retractions  LUNGS: Clear to auscultation bilaterally.  No stridor. No wheezes. No rales.  CARDIOVASCULAR: Regular rate and rhythm, positive S1-S2, no murmurs rubs or gallops appreciated. ABDOMINAL: No apparent ascites.  EXTREMITIES: No peripheral edema, left knee brace.  HEMATOLOGIC: No significant bruising NEUROLOGIC: Oriented to person, place, and time. Nonfocal. Normal muscle tone.  PSYCHIATRIC: Normal mood and affect. Normal behavior. Cooperative  CARDIAC DATABASE: EKG: 07/27/2021: NSR, 67 bpm, left axis, without underlying ischemia or injury pattern.    Echocardiogram: 12/20/2020: Normal LV systolic function with visual EF 60-65%. Left ventricle cavity is normal in size. Moderate left ventricular hypertrophy. Normal global wall motion. Normal diastolic filling pattern, normal LAP. Trace aortic regurgitation. Trace tricuspid regurgitation. No evidence of pulmonary hypertension. No prior study for comparison   Stress Testing: Exercise treadmill stress test 12/17/2020: Exercise treadmill stress test performed using Bruce protocol. Patient reached 10.1 METS, and 105% of age predicted maximum heart rate. Exercise capacity was excellent. No chest pain reported. Normal heart rate and hemodynamic response. Stress EKG revealed no ischemic changes. Low risk study.  Heart  Catheterization: None  Coronary artery calcium scoring 12/2020: 1. LAD and LEFT circumflex coronary artery calcification. 2. Total Agatston Score: 105 3. MESA age and sex matched database percentile: 4  LABORATORY DATA: No flowsheet data found.  CMP Latest Ref Rng & Units 07/25/2021 03/18/2021  Glucose 70 - 99 mg/dL 114(H) 95  BUN 8 - 27 mg/dL 20 17  Creatinine 0.76 - 1.27 mg/dL 1.08 1.09  Sodium 134 - 144 mmol/L 139 140  Potassium 3.5 - 5.2 mmol/L 3.7 3.8  Chloride 96 - 106 mmol/L 100 100  CO2 20 - 29 mmol/L 26 24  Calcium 8.6 - 10.2 mg/dL 9.5 9.0  Total Protein 6.0 - 8.5 g/dL 7.2 7.0  Total Bilirubin 0.0 - 1.2 mg/dL 0.6 0.9  Alkaline Phos 44 - 121 IU/L 88 79  AST 0 - 40 IU/L 22 23  ALT 0 - 44 IU/L 26 27    Lipid Panel     Component Value Date/Time   CHOL 136 07/25/2021 1039   TRIG 88 07/25/2021 1039   HDL 64 07/25/2021 1039   LDLCALC 55 07/25/2021 1039   LDLDIRECT 56 07/25/2021 1039   LABVLDL 17 07/25/2021 1039    No components found for: NTPROBNP No results for input(s): PROBNP in the last 8760 hours. No results for input(s): TSH in the last 8760 hours.  BMP Recent Labs    03/18/21 1159 07/25/21 1039  NA 140 139  K 3.8 3.7  CL 100 100  CO2 24 26  GLUCOSE 95 114*  BUN 17 20  CREATININE 1.09 1.08  CALCIUM 9.0 9.5    HEMOGLOBIN A1C No results found for: HGBA1C, MPG  External Labs: Collected: 11/16/2020 provided by PCP. Sodium 136, potassium 3.4, chloride 98, bicarb 30, BUN 17 Creatinine 1.21 mg/dL. eGFR: >60 mL/min per 1.73 m Hemoglobin 15.5 g/dL. Hematocrit 46%  IMPRESSION:    ICD-10-CM   1. Coronary atherosclerosis due to calcified coronary lesion  I25.10 EKG 12-Lead   I25.84     2. Benign hypertension  I10     3. OSA on CPAP  G47.33    Z99.89        RECOMMENDATIONS: BURNELL HURTA is a 70 y.o. male whose past medical history and cardiac risk factors include: Mild coronary artery calcification, hypertension, depression, OSA on CPAP,  depression.  Coronary atherosclerosis due to calcified coronary lesion:  Mild CAC noted in LAD/LCx distribution Continue aspirin and statin therapy. Recent labs 07/25/2021 independently reviewed.  LDL within acceptable range.  AST ALT within normal limits. Recently has undergone cardiovascular testing which were reviewed as part of today's office encounter noted above for further reference. Monitor for now. Recommend annual follow-up visit or sooner if change in clinical status. Will check lipids and CMP in 6 weeks to re-evaluate therapy.   Benign essential hypertension: Office blood pressure not at goal but working with PCP for medication titration. May also be secondary to pain involving the left knee. I have asked him to keep a log of his blood pressures and discussed the readings with his PCP to see if additional medication is warranted. Low-salt diet recommended.  Obstructive sleep apnea: Uses CPAP daily.  Independently reviewed labs dated 07/25/2021 as part of today's encounter.   FINAL MEDICATION LIST END OF ENCOUNTER: No orders of the defined types were placed in this encounter.   There are no discontinued medications.   Current Outpatient Medications:    acetaminophen (TYLENOL) 650 MG CR tablet, Take 650 mg by mouth every 8 (eight) hours as needed for pain., Disp: , Rfl:    amLODipine (NORVASC) 5 MG tablet, Take 1 tablet by mouth daily., Disp: , Rfl:    aspirin EC 81 MG tablet, Take 1 tablet (81 mg total) by mouth daily. Swallow whole., Disp: 90 tablet, Rfl: 3   azelastine (ASTELIN) 0.1 % nasal spray, Place into the nose., Disp: , Rfl:    buPROPion (WELLBUTRIN XL) 300 MG 24 hr tablet, Take 1 tablet by mouth daily., Disp: , Rfl:    hydrochlorothiazide (HYDRODIURIL) 25 MG tablet, Take 1 tablet by mouth daily., Disp: , Rfl:    rosuvastatin (CRESTOR) 5 MG tablet, TAKE 1 TABLET BY MOUTH EVERYDAY AT BEDTIME, Disp: 90 tablet, Rfl: 0  Orders Placed This Encounter  Procedures   EKG  12-Lead    There are no Patient Instructions on file for this visit.   --Continue cardiac medications as reconciled in final medication list. --Return in about 1 year (around 07/27/2022) for Follow up, Coronary artery calcification. Or sooner if needed. --Continue follow-up with your primary care physician regarding the management of your other chronic comorbid conditions.  Patient's questions and concerns were addressed to his satisfaction. He voices understanding of the instructions provided during this encounter.   This note was created using a voice recognition software as a result there may be grammatical errors inadvertently enclosed that do not reflect the nature of this encounter.  Every attempt is made to correct such errors.  Rex Kras, [place], [facility]  Pager: [phone] Office: [phone]

## 2021-08-09 ENCOUNTER — Ambulatory Visit: Payer: Medicare Other | Admitting: Orthopaedic Surgery

## 2022-04-04 ENCOUNTER — Other Ambulatory Visit: Payer: Self-pay

## 2022-04-04 ENCOUNTER — Emergency Department (HOSPITAL_BASED_OUTPATIENT_CLINIC_OR_DEPARTMENT_OTHER)
Admission: EM | Admit: 2022-04-04 | Discharge: 2022-04-05 | Disposition: A | Discharge status: Home / Self Care | Payer: Medicare Other | Attending: Emergency Medicine | Admitting: Emergency Medicine

## 2022-04-04 ENCOUNTER — Emergency Department (HOSPITAL_BASED_OUTPATIENT_CLINIC_OR_DEPARTMENT_OTHER): Payer: Medicare Other

## 2022-04-04 ENCOUNTER — Encounter (HOSPITAL_BASED_OUTPATIENT_CLINIC_OR_DEPARTMENT_OTHER): Payer: Self-pay | Admitting: Emergency Medicine

## 2022-04-04 DIAGNOSIS — I82461 Acute embolism and thrombosis of right calf muscular vein: Secondary | ICD-10-CM | POA: Insufficient documentation

## 2022-04-04 DIAGNOSIS — Z7982 Long term (current) use of aspirin: Secondary | ICD-10-CM | POA: Diagnosis not present

## 2022-04-04 DIAGNOSIS — I10 Essential (primary) hypertension: Secondary | ICD-10-CM | POA: Diagnosis not present

## 2022-04-04 DIAGNOSIS — Z79899 Other long term (current) drug therapy: Secondary | ICD-10-CM | POA: Insufficient documentation

## 2022-04-04 DIAGNOSIS — M79661 Pain in right lower leg: Secondary | ICD-10-CM | POA: Diagnosis present

## 2022-04-04 NOTE — ED Triage Notes (Signed)
Right calf pain. Started about a week ago. Travels frequently by plane. No swelling, no redness, yes tenderness

## 2022-04-05 ENCOUNTER — Other Ambulatory Visit (HOSPITAL_BASED_OUTPATIENT_CLINIC_OR_DEPARTMENT_OTHER): Payer: Self-pay

## 2022-04-05 DIAGNOSIS — I82461 Acute embolism and thrombosis of right calf muscular vein: Secondary | ICD-10-CM | POA: Diagnosis not present

## 2022-04-05 MED ORDER — RIVAROXABAN (XARELTO) EDUCATION KIT FOR DVT/PE PATIENTS: PACK | Freq: Once | Status: AC

## 2022-04-05 MED ORDER — RIVAROXABAN (XARELTO) VTE STARTER PACK (15 & 20 MG): ORAL_TABLET | ORAL | 0 refills | Status: DC

## 2022-04-05 MED ORDER — RIVAROXABAN 15 MG PO TABS
15.0000 mg | ORAL_TABLET | Freq: Once | ORAL | Status: AC
  Administered 2022-04-05: 15 mg via ORAL
  Filled 2022-04-05: qty 1

## 2022-04-05 NOTE — ED Provider Notes (Signed)
[facility] EMERGENCY DEPT Provider Note  CSN: [id number] Arrival date & time: 04/04/22 2200  Chief Complaint(s) Leg Pain  HPI Richard Lindsey is a 71 y.o. male with no pertinent past medical history who presents to the emergency department with right calf pain for approximately 1 week.  Patient reports that he travels frequently and last traveled in mid June.  Denies any prior history of DVT/PEs.  No fall or trauma.  Patient does have some mild swelling to the right lower extremity which she attributes to a previous injury.  Denies any chest pain or shortness of breath.  No other physical complaints.  The history is provided by the patient.    Past Medical History Past Medical History:  Diagnosis Date   Depression    Hypertension    Sleep apnea    There are no problems to display for this patient.  Home Medication(s) Prior to Admission medications   Medication Sig Start Date End Date Taking? Authorizing Provider  acetaminophen (TYLENOL) 650 MG CR tablet Take 650 mg by mouth every 8 (eight) hours as needed for pain.    [provider]  amLODipine (NORVASC) 5 MG tablet Take 1 tablet by mouth daily. 11/30/20   [provider]  aspirin EC 81 MG tablet Take 1 tablet (81 mg total) by mouth daily. Swallow whole. 01/25/21   Tolia, Sunit, DO  azelastine (ASTELIN) 0.1 % nasal spray Place into the nose.    [provider]  buPROPion (WELLBUTRIN XL) 300 MG 24 hr tablet Take 1 tablet by mouth daily. 11/10/20   [provider]  hydrochlorothiazide (HYDRODIURIL) 25 MG tablet Take 1 tablet by mouth daily. 11/28/20   [provider]  rosuvastatin (CRESTOR) 5 MG tablet TAKE 1 TABLET BY MOUTH EVERYDAY AT BEDTIME 04/22/21   Tolia, Sunit, DO                                                                                                                                    Allergies Patient has no known allergies.  Review of Systems Review of  Systems As noted in HPI  Physical Exam Vital Signs  I have reviewed the triage vital signs BP (!) 146/93   Pulse 66   Temp 98.4 F (36.9 C)   Resp 19   SpO2 95%   Physical Exam Vitals reviewed.  Constitutional:      General: He is not in acute distress.    Appearance: He is well-developed. He is not diaphoretic.  HENT:     Head: Normocephalic and atraumatic.     Right Ear: External ear normal.     Left Ear: External ear normal.     Nose: Nose normal.     Mouth/Throat:     Mouth: Mucous membranes are moist.  Eyes:     General: No scleral icterus.    Conjunctiva/sclera: Conjunctivae normal.  Neck:     Trachea:  Phonation normal.  Cardiovascular:     Rate and Rhythm: Normal rate and regular rhythm.  Pulmonary:     Effort: Pulmonary effort is normal. No respiratory distress.     Breath sounds: No stridor.  Abdominal:     General: There is no distension.  Musculoskeletal:        General: Normal range of motion.     Cervical back: Normal range of motion.     Right lower leg: Swelling (mild) and tenderness (mild) present.  Neurological:     Mental Status: He is alert and oriented to person, place, and time.  Psychiatric:        Behavior: Behavior normal.     ED Results and Treatments Labs (all labs ordered are listed, but only abnormal results are displayed) Labs Reviewed - No data to display                                                                                                                       EKG  EKG Interpretation  Date/Time:    Ventricular Rate:    PR Interval:    QRS Duration:   QT Interval:    QTC Calculation:   R Axis:     Text Interpretation:         Radiology US Venous Img Lower Unilateral Right  Result Date: 04/05/2022 CLINICAL DATA:  Right calf pain for 1 week. EXAM: RIGHT LOWER EXTREMITY VENOUS DOPPLER ULTRASOUND TECHNIQUE: Gray-scale sonography with compression, as well as color and duplex ultrasound, were performed to evaluate  the deep venous system(s) from the level of the common femoral vein through the popliteal and proximal calf veins. COMPARISON:  None Available. FINDINGS: VENOUS Normal compressibility of the common femoral, superficial femoral, and popliteal veins, as well as the visualized calf veins. Visualized portions of profunda femoral vein and great saphenous vein unremarkable. No filling defects to suggest DVT on grayscale or color Doppler imaging. Doppler waveforms show normal direction of venous flow, normal respiratory plasticity and response to augmentation. Limited views of the contralateral common femoral vein are unremarkable. OTHER In region of reported pain in the calf, the medial gastrocnemius veins do not fully compress just inferior to the popliteal junction. Limitations: none IMPRESSION: No evidence of deep venous thrombosis involving the common femoral, saphenofemoral, profunda femoral, femoral, popliteal, posterior tibial, and peroneal veins. In the region of reported pain in the calf, the questionable small DVT involving the medial gastrocnemius veins. Electronically Signed   By: Brett Fairy M.D.   On: 04/05/2022 00:06    Medications Ordered in ED Medications  rivaroxaban Alveda Reasons) Education Kit for DVT/PE patients ( Does not apply Given 04/05/22 0106)  Rivaroxaban (XARELTO) tablet 15 mg (15 mg Oral Given 04/05/22 0106)  Procedures Procedures  (including critical care time)  Medical Decision Making / ED Course   Medical Decision Making Amount and/or Complexity of Data Reviewed External Data Reviewed: labs.    Details: Patient had labs in March of this year that did not show any evidence of renal insufficiency Radiology: ordered and independent interpretation performed. Decision-making details documented in ED Course.  Risk Prescription drug management.     Given recent travel, will obtain ultrasound to assess for DVT. Ultrasound was notable for possible small DVT. Patient will be started on Xarelto and given a starter pack. Recommend close follow-up with PCP.        Final Clinical Impression(s) / ED Diagnoses Final diagnoses:  Acute deep vein thrombosis (DVT) of calf muscle vein of right lower extremity ([place])   The patient appears reasonably screened and/or stabilized for discharge and I doubt any other medical condition or other [facility] requiring further screening, evaluation, or treatment in the ED at this time. I have discussed the findings, Dx and Tx plan with the patient/family who expressed understanding and agree(s) with the plan. Discharge instructions discussed at length. The patient/family was given strict return precautions who verbalized understanding of the instructions. No further questions at time of discharge.  Disposition: Discharge  Condition: Good  ED Discharge Orders     None       Follow Up: Pieter Partridge, PA [address] [place] [address] [phone]  Call  to schedule an appointment for close follow up           This chart was dictated using voice recognition software.  Despite best efforts to proofread,  errors can occur which can change the documentation meaning.    Fatima Blank, MD 04/05/22 0110

## 2022-05-15 ENCOUNTER — Other Ambulatory Visit (HOSPITAL_BASED_OUTPATIENT_CLINIC_OR_DEPARTMENT_OTHER): Payer: Self-pay

## 2022-07-28 ENCOUNTER — Encounter: Payer: Self-pay | Admitting: Cardiology

## 2022-07-28 ENCOUNTER — Ambulatory Visit: Payer: Medicare Other | Admitting: Cardiology

## 2022-07-28 VITALS — BP 133/73 | HR 56 | Ht 73.0 in | Wt 214.0 lb

## 2022-07-28 DIAGNOSIS — G4733 Obstructive sleep apnea (adult) (pediatric): Secondary | ICD-10-CM

## 2022-07-28 DIAGNOSIS — I2510 Atherosclerotic heart disease of native coronary artery without angina pectoris: Secondary | ICD-10-CM

## 2022-07-28 DIAGNOSIS — I10 Essential (primary) hypertension: Secondary | ICD-10-CM

## 2022-07-28 NOTE — Progress Notes (Signed)
ID:  Richard Lindsey, DOB [birth year], MRN [id number]  PCP:  Pieter Partridge, PA (Inactive)  Cardiologist:  Rex Kras, DO, [facility] (established care 12/07/2020)  Date: 07/28/22 Last Office Visit: 07/27/2021   Chief Complaint  Patient presents with   Coronary atherosclerosis due to calcified coronary lesion    HPI  Richard Lindsey is a 71 y.o. male whose past medical history and cardiovascular risk factors include: Mild coronary artery calcification, Hypertension, depression, OSA on CPAP, depression.  Referred to the practice in April 2022 for evaluation of chest pain.  He is undergone appropriate cardiovascular workup as outlined below which has noted mild coronary artery calcification, preserved LVEF, and low risk stress test.  He presents today for 1 year follow-up visit for longitudinal management of subclinical CAD.  Since last office visit, denies any anginal discomfort or heart failure symptoms.  Over the last 1 year he has had 1 episode of what appears to be noncardiac left pectoral discomfort most likely musculoskeletal in etiology.  Overall functional capacity remains stable.  Due to lifestyle changes he is lost approximately 8 pounds since last office visit for which she is congratulated for at this visit.  Outside labs independently reviewed and noted below for further reference.  FUNCTIONAL STATUS: 9,000 - 11,000 steps / day.    ALLERGIES: Allergies  Allergen Reactions   Grass Pollen(K-O-[id number]-Swt Vern) Other (See Comments)    MEDICATION LIST PRIOR TO VISIT: Current Meds  Medication Sig   acetaminophen (TYLENOL) 650 MG CR tablet Take 650 mg by mouth every 8 (eight) hours as needed for pain.   amLODipine (NORVASC) 10 MG tablet Take 10 mg by mouth daily.   aspirin EC 81 MG tablet Take 81 mg by mouth daily. Swallow whole.   azelastine (ASTELIN) 0.1 % nasal spray Place into the nose.   hydrochlorothiazide (HYDRODIURIL) 25 MG tablet Take 1 tablet by mouth daily.    rosuvastatin (CRESTOR) 5 MG tablet TAKE 1 TABLET BY MOUTH EVERYDAY AT BEDTIME   sertraline (ZOLOFT) 50 MG tablet Take 1 tablet by mouth daily.     PAST MEDICAL HISTORY: Past Medical History:  Diagnosis Date   Coronary artery calcification    Depression    Hypertension    Sleep apnea     PAST SURGICAL HISTORY: Past Surgical History:  Procedure Laterality Date   FINGER TENDON REPAIR     NASAL POLYP SURGERY     NASAL SEPTUM SURGERY     UVULOPALATOPHARYNGOPLASTY      FAMILY HISTORY: The patient family history includes Arthritis in his brother; Congestive Heart Failure in his father; Heart attack in his father; Lymphoma in his brother; Stroke in his mother.  SOCIAL HISTORY:  The patient  reports that he has never smoked. He has never used smokeless tobacco. He reports current alcohol use of about 2.0 standard drinks of alcohol per week. He reports that he does not use drugs.  REVIEW OF SYSTEMS: Review of Systems  Constitutional: Negative for chills and fever.  HENT:  Negative for hoarse voice and nosebleeds.   Eyes:  Negative for discharge, double vision and pain.  Cardiovascular:  Negative for chest pain, claudication, dyspnea on exertion, leg swelling, near-syncope, orthopnea, palpitations, paroxysmal nocturnal dyspnea and syncope.  Respiratory:  Negative for hemoptysis and shortness of breath.   Musculoskeletal:  Negative for muscle cramps and myalgias.  Gastrointestinal:  Negative for abdominal pain, constipation, diarrhea, hematemesis, hematochezia, melena, nausea and vomiting.  Neurological:  Negative for dizziness and light-headedness.  PHYSICAL EXAM: Today's Vitals   07/28/22 0959  BP: 133/73  Pulse: (!) 56  SpO2: 96%  Weight: 214 lb (97.1 kg)  Height: 6' 1" (1.854 m)   Body mass index is 28.23 kg/m.  Physical Exam  Constitutional: No distress.  Age appropriate, hemodynamically stable.   Neck: No JVD present.  Cardiovascular: Normal rate, regular rhythm,  S1 normal, S2 normal, intact distal pulses and normal pulses. Exam reveals no gallop, no S3 and no S4.  No murmur heard. Pulses:      Radial pulses are 2+ on the right side and 2+ on the left side.       Dorsalis pedis pulses are 2+ on the right side and 2+ on the left side.       Posterior tibial pulses are 2+ on the right side and 2+ on the left side.  Pulmonary/Chest: Effort normal and breath sounds normal. No stridor. He has no wheezes. He has no rales.  Abdominal: Soft. Bowel sounds are normal. He exhibits no distension. There is no abdominal tenderness.  Musculoskeletal:        General: No edema.     Cervical back: Neck supple.  Neurological: He is alert and oriented to person, place, and time. He has intact cranial nerves (2-12).  Skin: Skin is warm and moist.   CARDIAC DATABASE: EKG: 07/28/2022: Sinus bradycardia, 51 bpm, without underlying ischemia or injury pattern.  Echocardiogram: 12/20/2020: Normal LV systolic function with visual EF 60-65%. Left ventricle cavity is normal in size. Moderate left ventricular hypertrophy. Normal global wall motion. Normal diastolic filling pattern, normal LAP. Trace aortic regurgitation. Trace tricuspid regurgitation. No evidence of pulmonary hypertension. No prior study for comparison   Stress Testing: Exercise treadmill stress test 12/17/2020: Exercise treadmill stress test performed using Bruce protocol. Patient reached 10.1 METS, and 105% of age predicted maximum heart rate. Exercise capacity was excellent. No chest pain reported. Normal heart rate and hemodynamic response. Stress EKG revealed no ischemic changes. Low risk study.  Heart Catheterization: None  Coronary artery calcium scoring 12/2020: 1. LAD and LEFT circumflex coronary artery calcification. 2. Total Agatston Score: 105 3. MESA age and sex matched database percentile: 71  LABORATORY DATA:     No data to display             Latest Ref Rng & Units 07/25/2021    10:39 AM 03/18/2021   11:59 AM  CMP  Glucose 70 - 99 mg/dL 114  95   BUN 8 - 27 mg/dL 20  17   Creatinine 0.76 - 1.27 mg/dL 1.08  1.09   Sodium 134 - 144 mmol/L 139  140   Potassium 3.5 - 5.2 mmol/L 3.7  3.8   Chloride 96 - 106 mmol/L 100  100   CO2 20 - 29 mmol/L 26  24   Calcium 8.6 - 10.2 mg/dL 9.5  9.0   Total Protein 6.0 - 8.5 g/dL 7.2  7.0   Total Bilirubin 0.0 - 1.2 mg/dL 0.6  0.9   Alkaline Phos 44 - 121 IU/L 88  79   AST 0 - 40 IU/L 22  23   ALT 0 - 44 IU/L 26  27     Lipid Panel     Component Value Date/Time   CHOL 136 07/25/2021 1039   TRIG 88 07/25/2021 1039   HDL 64 07/25/2021 1039   LDLCALC 55 07/25/2021 1039   LDLDIRECT 56 07/25/2021 1039   LABVLDL 17 07/25/2021 1039  No components found for: "NTPROBNP" No results for input(s): "PROBNP" in the last 8760 hours. No results for input(s): "TSH" in the last 8760 hours.  BMP No results for input(s): "NA", "K", "CL", "CO2", "GLUCOSE", "BUN", "CREATININE", "CALCIUM", "GFRNONAA", "GFRAA" in the last 8760 hours.   HEMOGLOBIN A1C No results found for: "HGBA1C", "MPG"  External Labs: Collected: 11/16/2020 provided by PCP. Sodium 136, potassium 3.4, chloride 98, bicarb 30, BUN 17 Creatinine 1.21 mg/dL. eGFR: >60 mL/min per 1.73 m Hemoglobin 15.5 g/dL. Hematocrit 46%  Collected: 07/25/2022 available in Care Everywhere. Sodium 139, potassium 4.1, chloride 103, bicarb 26, BUN 19, creatinine 1.07. AST 23, ALT 27, alkaline phosphatase 69. Total cholesterol 129, triglycerides 119, HDL 48, LDL direct 60, non-HDL 81  IMPRESSION:    ICD-10-CM   1. Coronary atherosclerosis due to calcified coronary lesion  I25.10 EKG 12-Lead   I25.84     2. Benign hypertension  I10     3. OSA on CPAP  G47.33        RECOMMENDATIONS: CLEDITH ABDOU is a 71 y.o. male whose past medical history and cardiac risk factors include: Mild coronary artery calcification, hypertension, depression, OSA on CPAP, depression.  Coronary  atherosclerosis due to calcified coronary lesion Mild CAC noted in the LAD/LCx distribution. Continue aspirin 81 mg p.o. daily. Continue statin therapy. Outside labs from December 2023 independently reviewed and noted above for further reference. Most recent echo and stress test from 2022 reviewed as part of today's office visit.  Benign hypertension Office blood pressures are well-controlled. Medications reconciled. No changes warranted at this time.  Currently managed by PCP.  OSA on CPAP Reemphasized importance of device therapy/compliance.  FINAL MEDICATION LIST END OF ENCOUNTER: No orders of the defined types were placed in this encounter.   Medications Discontinued During This Encounter  Medication Reason   amLODipine (NORVASC) 5 MG tablet Dose change   buPROPion (WELLBUTRIN XL) 300 MG 24 hr tablet Discontinued by provider   RIVAROXABAN (XARELTO) VTE STARTER PACK (15 & 20 MG) Discontinued by provider     Current Outpatient Medications:    acetaminophen (TYLENOL) 650 MG CR tablet, Take 650 mg by mouth every 8 (eight) hours as needed for pain., Disp: , Rfl:    amLODipine (NORVASC) 10 MG tablet, Take 10 mg by mouth daily., Disp: , Rfl:    aspirin EC 81 MG tablet, Take 81 mg by mouth daily. Swallow whole., Disp: , Rfl:    azelastine (ASTELIN) 0.1 % nasal spray, Place into the nose., Disp: , Rfl:    hydrochlorothiazide (HYDRODIURIL) 25 MG tablet, Take 1 tablet by mouth daily., Disp: , Rfl:    rosuvastatin (CRESTOR) 5 MG tablet, TAKE 1 TABLET BY MOUTH EVERYDAY AT BEDTIME, Disp: 90 tablet, Rfl: 0   sertraline (ZOLOFT) 50 MG tablet, Take 1 tablet by mouth daily., Disp: , Rfl:   Orders Placed This Encounter  Procedures   EKG 12-Lead    There are no Patient Instructions on file for this visit.   --Continue cardiac medications as reconciled in final medication list. --Return in about 1 year (around 07/29/2023) for Annual follow up visit, Coronary artery calcification. Or sooner  if needed. --Continue follow-up with your primary care physician regarding the management of your other chronic comorbid conditions.  Patient's questions and concerns were addressed to his satisfaction. He voices understanding of the instructions provided during this encounter.   This note was created using a voice recognition software as a result there may be grammatical errors inadvertently  enclosed that do not reflect the nature of this encounter. Every attempt is made to correct such errors.  Rex Kras, [place], [facility]  Pager: [phone] Office: [phone]

## 2022-10-12 ENCOUNTER — Other Ambulatory Visit: Payer: Self-pay | Admitting: Family Medicine

## 2022-10-12 DIAGNOSIS — T23039A Burn of unspecified degree of unspecified multiple fingers (nail), not including thumb, initial encounter: Secondary | ICD-10-CM

## 2022-10-12 MED ORDER — SILVER SULFADIAZINE 1 % EX CREA: 1.0000 | TOPICAL_CREAM | Freq: Every day | CUTANEOUS | 0 refills | Status: DC

## 2023-08-03 ENCOUNTER — Ambulatory Visit: Payer: Medicare Other | Attending: Cardiology | Admitting: Cardiology

## 2023-09-07 ENCOUNTER — Encounter: Payer: Self-pay | Admitting: Cardiology

## 2023-09-07 ENCOUNTER — Ambulatory Visit: Payer: Medicare Other | Attending: Cardiology | Admitting: Cardiology

## 2023-09-07 VITALS — BP 120/74 | HR 71 | Resp 16 | Ht 73.0 in | Wt 228.4 lb

## 2023-09-07 DIAGNOSIS — I10 Essential (primary) hypertension: Secondary | ICD-10-CM | POA: Insufficient documentation

## 2023-09-07 DIAGNOSIS — G4733 Obstructive sleep apnea (adult) (pediatric): Secondary | ICD-10-CM | POA: Diagnosis not present

## 2023-09-07 DIAGNOSIS — I2510 Atherosclerotic heart disease of native coronary artery without angina pectoris: Secondary | ICD-10-CM | POA: Diagnosis not present

## 2023-09-07 DIAGNOSIS — I2584 Coronary atherosclerosis due to calcified coronary lesion: Secondary | ICD-10-CM | POA: Diagnosis not present

## 2023-09-07 NOTE — Progress Notes (Unsigned)
Cardiology Office Note:  .   Date:  09/07/2023  ID:  Richard Lindsey, DOB [birth year], MRN [id number] PCP:  Mattie Marlin, DO  Former Cardiology Providers: None [facility] Providers Cardiologist:  Tessa Lerner, DO , [facility] (established care December 07 2020) Electrophysiologist:  None  Click to update primary MD,subspecialty MD or APP then REFRESH:1}    Chief Complaint  Patient presents with   Coronary atherosclerosis due to calcified coronary lesion   Follow-up    History of Present Illness: .   Richard Lindsey is a 73 y.o. Caucasian male whose past medical history and cardiovascular risk factors includes: Non-insulin-dependent diabetes mellitus type 2, mild coronary artery calcification, Hypertension, depression, OSA on CPAP, depression.   Patient has known history of coronary calcification and presents today for 1 year follow-up visit.  Over the last 1 year he denies anginal chest pain or heart failure symptoms.  He was diagnosed with prediabetes and is quite motivated with regards to improving his glycemic control.  No hospitalizations or urgent care visits for cardiovascular reasons.  Review of Systems: .   Review of Systems  Cardiovascular:  Negative for chest pain, claudication, irregular heartbeat, leg swelling, near-syncope, orthopnea, palpitations, paroxysmal nocturnal dyspnea and syncope.  Respiratory:  Negative for shortness of breath.   Hematologic/Lymphatic: Negative for bleeding problem.   Studies Reviewed:   EKG: EKG Interpretation Date/Time:  Friday September 07 2023 10:40:01 EST Ventricular Rate:  69 PR Interval:  174 QRS Duration:  86 QT Interval:  410 QTC Calculation: 439 R Axis:   -19  Text Interpretation: Normal sinus rhythm Normal ECG No previous ECGs available Confirmed by Tessa Lerner [phone]) on 09/07/2023 10:48:29 AM  Echocardiogram: 12/20/2020: Normal LV systolic function with visual EF 60-65%. Left ventricle cavity is normal in size. Moderate  left ventricular hypertrophy. Normal global wall motion. Normal diastolic filling pattern, normal LAP. Trace aortic regurgitation. Trace tricuspid regurgitation. No evidence of pulmonary hypertension. No prior study for comparison   Stress Testing: Exercise treadmill stress test 12/17/2020: Exercise treadmill stress test performed using Bruce protocol. Patient reached 10.1 METS, and 105% of age predicted maximum heart rate. Exercise capacity was excellent. No chest pain reported. Normal heart rate and hemodynamic response. Stress EKG revealed no ischemic changes. Low risk study.   Heart Catheterization: None   Coronary artery calcium scoring 12/2020: 1. LAD and LEFT circumflex coronary artery calcification. 2. Total Agatston Score: 105 3. MESA age and sex matched database percentile: 44  RADIOLOGY: N/A  Risk Assessment/Calculations:   N/A   Labs:     Collected: 07/25/2022 available in Care Everywhere. Sodium 139, potassium 4.1, chloride 103, bicarb 26, BUN 19, creatinine 1.07. AST 23, ALT 27, alkaline phosphatase 69. Total cholesterol 129, triglycerides 119, HDL 48, LDL direct 60, non-HDL 81  External Labs: Collected: July 26, 2023 Hemoglobin 15.3 g/dL. Total cholesterol 137, triglycerides 93, HDL 51, LDL calculated 68, non-HDL 86. A1c 6.5. Potassium 4.6. BUN 25, creatinine 1.08. AST, ALT, alkaline phosphatase within normal limits  Physical Exam:    Today's Vitals   09/07/23 1037  BP: 120/74  Pulse: 71  Resp: 16  SpO2: 98%  Weight: 228 lb 6.4 oz (103.6 kg)  Height: 6\' 1"  (1.854 m)   Body mass index is 30.13 kg/m. Wt Readings from Last 3 Encounters:  09/07/23 228 lb 6.4 oz (103.6 kg)  07/28/22 214 lb (97.1 kg)  07/27/21 222 lb (100.7 kg)    Physical Exam  Constitutional: No distress.  hemodynamically stable  Neck: No JVD present.  Cardiovascular: Normal rate, regular rhythm, S1 normal and S2 normal. Exam reveals no gallop, no S3 and no S4.  No murmur  heard. Pulmonary/Chest: Effort normal and breath sounds normal. No stridor. He has no wheezes. He has no rales.  Abdominal: Soft. Bowel sounds are normal. He exhibits no distension. There is no abdominal tenderness.  Musculoskeletal:        General: No edema.     Cervical back: Neck supple.  Neurological: He is alert and oriented to person, place, and time. He has intact cranial nerves (2-12).  Skin: Skin is warm.     Impression & Recommendation(s):  Impression:   ICD-10-CM   1. Coronary atherosclerosis due to calcified coronary lesion  I25.10 EKG 12-Lead   I25.84 ECHOCARDIOGRAM COMPLETE    2. Benign hypertension  I10     3. OSA on CPAP  G47.33        Recommendation(s):  Coronary atherosclerosis due to calcified coronary lesion Denies anginal chest pain or heart failure symptoms. Continue aspirin 81 mg p.o. daily. Continue Crestor 5 mg p.o. nightly. Prior echo and stress test results reviewed. Echo will be ordered to evaluate for structural heart disease and left ventricular systolic function. Reemphasized the importance of secondary prevention with focus on improving her modifiable cardiovascular risk factors such as glycemic control, lipid management, blood pressure control, weight loss.  Benign hypertension Office blood pressures are very well-controlled. Continue amlodipine 10 mg p.o. daily. Patient informed me that hydrochlorothiazide 25 mg p.o. daily was discontinued due to concerns for orthostasis. Reemphasized importance of low-salt diet.  OSA on CPAP Reemphasized importance of device compliance.   Orders Placed:  Orders Placed This Encounter  Procedures   EKG 12-Lead   ECHOCARDIOGRAM COMPLETE    Standing Status:   Future    Expected Date:   09/06/2024    Expiration Date:   09/06/2024    Where should this test be performed:   [facility])    Does the patient weigh less than or greater than 250 lbs?:   Patient weighs less than 250 lbs     Perflutren DEFINITY (image enhancing agent) should be administered unless hypersensitivity or allergy exist:   Administer Perflutren    Reason for exam-Echo:   Other-Full Diagnosis List    Full ICD-10/Reason for Exam:   Agatston coronary artery calcium score between 100 and 199 [730342]   Discussed management of at least 2 chronic comorbid conditions, EKG ordered and independently reviewed, prior coronary calcium score and echocardiogram results reviewed, outside labs from 07/26/2023 independently reviewed and noted above from Care Everywhere.   Final Medication List:   No orders of the defined types were placed in this encounter.   There are no discontinued medications.   Current Outpatient Medications:    amLODipine (NORVASC) 10 MG tablet, Take 10 mg by mouth daily., Disp: , Rfl:    aspirin EC 81 MG tablet, Take 81 mg by mouth daily. Swallow whole., Disp: , Rfl:    azelastine (ASTELIN) 0.1 % nasal spray, Place into the nose., Disp: , Rfl:    meloxicam (MOBIC) 15 MG tablet, Take 15 mg by mouth daily., Disp: , Rfl:    metFORMIN (GLUCOPHAGE) 850 MG tablet, Take 850 mg by mouth in the morning and at bedtime., Disp: , Rfl:    rosuvastatin (CRESTOR) 5 MG tablet, TAKE 1 TABLET BY MOUTH EVERYDAY AT BEDTIME, Disp: 90 tablet, Rfl: 0   sertraline (ZOLOFT) 50 MG tablet,  Take 1 tablet by mouth daily., Disp: , Rfl:    silver sulfADIAZINE (SILVADENE) 1 % cream, Apply 1 Application topically daily., Disp: 50 g, Rfl: 0   acetaminophen (TYLENOL) 650 MG CR tablet, Take 650 mg by mouth every 8 (eight) hours as needed for pain., Disp: , Rfl:    hydrochlorothiazide (HYDRODIURIL) 25 MG tablet, Take 1 tablet by mouth daily., Disp: , Rfl:   Consent:   NA  Disposition:   1 year sooner if needed.  Patient may be asked to follow-up sooner based on the results of the above-mentioned testing.  His questions and concerns were addressed to his satisfaction. He voices understanding of the recommendations provided  during this encounter.    Signed, Tessa Lerner, DO, [facility]  [facility]  [address] [place], [place] [address] 09/07/2023 12:47 PM

## 2023-09-07 NOTE — Patient Instructions (Signed)
Testing/Procedures: Your physician has requested that you have an echocardiogram prior to your 1 year follow up. Echocardiography is a painless test that uses sound waves to create images of your heart. It provides your doctor with information about the size and shape of your heart and how well your heart's chambers and valves are working. This procedure takes approximately one hour. There are no restrictions for this procedure. Please do NOT wear cologne, perfume, aftershave, or lotions (deodorant is allowed). Please arrive 15 minutes prior to your appointment time.  Please note: We ask at that you not bring children with you during ultrasound (echo/ vascular) testing. Due to room size and safety concerns, children are not allowed in the ultrasound rooms during exams. Our front office staff cannot provide observation of children in our lobby area while testing is being conducted. An adult accompanying a patient to their appointment will only be allowed in the ultrasound room at the discretion of the ultrasound technician under special circumstances. We apologize for any inconvenience.  Follow-Up: At [facility], you and your health needs are our priority.  As part of our continuing mission to provide you with exceptional heart care, we have created designated Provider Care Teams.  These Care Teams include your primary Cardiologist (physician) and Advanced Practice Providers (APPs -  Physician Assistants and Nurse Practitioners) who all work together to provide you with the care you need, when you need it.  We recommend signing up for the patient portal called "MyChart".  Sign up information is provided on this After Visit Summary.  MyChart is used to connect with patients for Virtual Visits (Telemedicine).  Patients are able to view lab/test results, encounter notes, upcoming appointments, etc.  Non-urgent messages can be sent to your provider as well.   To learn more about what you can do with  MyChart, go to [email or web address].    Your next appointment:   1 year(s)  Provider:   Tessa Lerner, DO       1st Floor: - Lobby - Registration  - Pharmacy  - Lab - Cafe  2nd Floor: - PV Lab - Diagnostic Testing (echo, CT, nuclear med)  3rd Floor: - Vacant  4th Floor: - TCTS (cardiothoracic surgery) - AFib Clinic - Structural Heart Clinic - Vascular Surgery  - Vascular Ultrasound  5th Floor: - HeartCare Cardiology (general and EP) - Clinical Pharmacy for coumadin, hypertension, lipid, weight-loss medications, and med management appointments    Valet parking services will be available as well.

## 2023-09-09 ENCOUNTER — Encounter: Payer: Self-pay | Admitting: Cardiology

## 2024-07-28 ENCOUNTER — Ambulatory Visit (HOSPITAL_COMMUNITY): Admission: RE | Admit: 2024-07-28 | Payer: Medicare Other | Source: Ambulatory Visit

## 2024-07-28 ENCOUNTER — Telehealth (HOSPITAL_COMMUNITY): Payer: Self-pay | Admitting: Cardiology

## 2024-07-28 NOTE — Telephone Encounter (Signed)
 Patient NO SHOWED scheduled echocardiogram 07/28/24.  We will not reach out to reschedule due to NO SHOW RATE 15%. If pateint calls back we will reschedule. Thank you.

## 2024-07-30 ENCOUNTER — Encounter: Payer: Self-pay | Admitting: Cardiology

## 2024-08-22 ENCOUNTER — Ambulatory Visit (HOSPITAL_COMMUNITY)
Admission: RE | Admit: 2024-08-22 | Discharge: 2024-08-22 | Disposition: A | Source: Ambulatory Visit | Attending: Internal Medicine

## 2024-08-22 DIAGNOSIS — I2584 Coronary atherosclerosis due to calcified coronary lesion: Secondary | ICD-10-CM | POA: Diagnosis present

## 2024-08-22 DIAGNOSIS — I2510 Atherosclerotic heart disease of native coronary artery without angina pectoris: Secondary | ICD-10-CM | POA: Diagnosis present

## 2024-08-22 LAB — ECHOCARDIOGRAM COMPLETE: S' Lateral: 2.59 cm

## 2024-08-25 ENCOUNTER — Ambulatory Visit: Payer: Self-pay | Admitting: Cardiology

## 2024-08-26 ENCOUNTER — Encounter: Payer: Self-pay | Admitting: Cardiology

## 2024-08-29 ENCOUNTER — Ambulatory Visit (HOSPITAL_COMMUNITY)

## 2024-09-09 ENCOUNTER — Ambulatory Visit: Admitting: Cardiology

## 2024-09-09 ENCOUNTER — Encounter: Payer: Self-pay | Admitting: Cardiology

## 2024-09-09 VITALS — BP 120/70 | HR 60 | Resp 16 | Ht 73.0 in | Wt 227.2 lb

## 2024-09-09 DIAGNOSIS — I10 Essential (primary) hypertension: Secondary | ICD-10-CM | POA: Insufficient documentation

## 2024-09-09 DIAGNOSIS — I2584 Coronary atherosclerosis due to calcified coronary lesion: Secondary | ICD-10-CM | POA: Diagnosis not present

## 2024-09-09 DIAGNOSIS — R9431 Abnormal electrocardiogram [ECG] [EKG]: Secondary | ICD-10-CM | POA: Insufficient documentation

## 2024-09-09 DIAGNOSIS — I2510 Atherosclerotic heart disease of native coronary artery without angina pectoris: Secondary | ICD-10-CM | POA: Insufficient documentation

## 2024-09-09 DIAGNOSIS — G4733 Obstructive sleep apnea (adult) (pediatric): Secondary | ICD-10-CM | POA: Insufficient documentation

## 2024-09-09 MED ORDER — METOPROLOL TARTRATE 25 MG PO TABS: ORAL_TABLET | ORAL | 0 refills | Status: AC

## 2024-09-09 NOTE — Progress Notes (Signed)
 " Cardiology Office Note:  .   Date:  09/09/2024  ID:  Richard Lindsey, DOB 1951/01/06, MRN [id number] PCP:  Lindsey, Richard P, DO  Former Cardiology Providers: None [facility] Providers Cardiologist:  Richard Large, DO , [facility] (established care December 07 2020) Electrophysiologist:  None  Click to update primary MD,subspecialty MD or APP then REFRESH:1}    Chief Complaint  Patient presents with   Coronary atherosclerosis due to calcified coronary lesion   Follow-up    History of Present Illness: .   Richard Lindsey is a 74 y.o. Caucasian male whose past medical history and cardiovascular risk factors includes: Non-insulin-dependent diabetes mellitus type 2, mild coronary artery calcification, Hypertension, depression, OSA on CPAP, depression.   Patient has known history of coronary calcification and presents today for 1 year follow-up visit.  Last office visit patient was doing well from a cardiovascular standpoint and is shared decision was to proceed with echocardiogram to reevaluate LVEF and valvular heart disease.  He presents today for 1 year follow-up visit.  Since last office visit Bane denies any anginal chest pain or heart failure symptoms.   No hospitalizations or urgent care visits for cardiovascular reasons.   He has been compliant with his medical therapy and endorses no concerns.  No significant weight change.  Physical endurance remains stable.  Home SBP are well controlled. Norvasc reduced due to softer reading after starting BP medications.  At times lightheaded with exercise - working with a trainer so he is getting pushed to his limits.    Review of Systems: .   Review of Systems  Cardiovascular:  Negative for chest pain, claudication, irregular heartbeat, leg swelling, near-syncope, orthopnea, palpitations, paroxysmal nocturnal dyspnea and syncope.  Respiratory:  Negative for shortness of breath.   Hematologic/Lymphatic: Negative for bleeding problem.    Studies Reviewed:   EKG: EKG Interpretation Date/Time:  Tuesday September 09 2024 13:20:40 EST Ventricular Rate:  62 PR Interval:  178 QRS Duration:  82 QT Interval:  438 QTC Calculation: 444 R Axis:   -26  Text Interpretation: Normal sinus rhythm Minimal voltage criteria for LVH, may be normal variant ( R in aVL ) When compared with ECG of 07-Sep-2023 10:40, Inverted T waves have replaced nonspecific T wave abnormality in Inferior leads Confirmed by Lindsey Richard [phone]) on 09/09/2024 1:22:32 PM  Echocardiogram: 12/20/2020: LVEF 60 to 65%, normal diastolic filling pattern, no significant valvular heart disease.  0/08/23/2023 LVEF: 55-60% Diastolic Function: Normal Right ventricle: Size and function normal Regurgitation: No significant Stenosis: No significant Aorta: Root 33 mm, proximal ascending aorta 37 mm Estimated RAP 3 mmHg See report for additional details    Stress Testing: Exercise treadmill stress test 12/17/2020: Patient reached 10.1 METS, and 105% of age predicted maximum heart rate. Exercise capacity was excellent. No chest pain reported. Normal heart rate and hemodynamic response. Stress EKG revealed no ischemic changes. Low risk study.   Heart Catheterization: None   Coronary artery calcium  scoring 12/2020: 1. LAD and LEFT circumflex coronary artery calcification. 2. Total Agatston Score: 105 3. MESA age and sex matched database percentile: 67  RADIOLOGY: N/A  Risk Assessment/Calculations:   N/A   Labs:     Collected: 07/25/2022 available in Care Everywhere. Sodium 139, potassium 4.1, chloride 103, bicarb 26, BUN 19, creatinine 1.07. AST 23, ALT 27, alkaline phosphatase 69. Total cholesterol 129, triglycerides 119, HDL 48, LDL direct 60, non-HDL 81  External Labs: Collected: July 26, 2023 Hemoglobin 15.3 g/dL. Total cholesterol 137,  triglycerides 93, HDL 51, LDL calculated 68, non-HDL 86. A1c 6.5. Potassium 4.6. BUN 25, creatinine 1.08. AST,  ALT, alkaline phosphatase within normal limits  Physical Exam:    Today's Vitals   09/09/24 1317  BP: 120/70  Pulse: 60  Resp: 16  SpO2: 95%  Weight: 227 lb 3.2 oz (103.1 kg)  Height: 6' 1 (1.854 m)    Body mass index is 29.98 kg/m. Wt Readings from Last 3 Encounters:  09/09/24 227 lb 3.2 oz (103.1 kg)  09/07/23 228 lb 6.4 oz (103.6 kg)  07/28/22 214 lb (97.1 kg)    Physical Exam  Constitutional: No distress.  hemodynamically stable  Neck: No JVD present.  Cardiovascular: Normal rate, regular rhythm, S1 normal and S2 normal. Exam reveals no gallop, no S3 and no S4.  No murmur heard. Pulmonary/Chest: Effort normal and breath sounds normal. No stridor. He has no wheezes. He has no rales.  Abdominal: Soft. Bowel sounds are normal. He exhibits no distension. There is no abdominal tenderness.  Musculoskeletal:        General: No edema.     Cervical back: Neck supple.  Neurological: He is alert and oriented to person, place, and time. He has intact cranial nerves (2-12).  Skin: Skin is warm.     Impression & Recommendation(s):  Impression:   ICD-10-CM   1. Coronary atherosclerosis due to calcified coronary lesion  I25.10 EKG 12-Lead   I25.84     2. Nonspecific abnormal electrocardiogram (ECG) (EKG)  R94.31 CT CORONARY MORPH W/CTA COR W/SCORE W/CA W/CM &/OR WO/CM    3. Benign hypertension  I10     4. OSA on CPAP  G47.33        Recommendation(s):  Coronary atherosclerosis due to calcified coronary lesion Asymptomatic. Good functional capacity for age, working with the trainer and also does 20 to 30 minutes of cardiovascular workouts. Incidentally noted to have TWI in one inferior lead when compared to prior tracing he had nonspecific changes.  Patient is a diabetic and known history of mild CAC. Recent echocardiogram notes preserved LVEF and no regional wall motion abnormalities. Recommended either following him clinically and if he develops symptoms to proceed with  testing versus proceeding with testing given the change in EKG and comorbid conditions.  Patient favors the latter. Coronary CTA to evaluate for CAC, plaque burden, and obstructive disease Lopressor  25 mg 1 tablet 2 hours prior to his CT scan.  That day he is advised to skip his BP pills. Further recommendations to follow Will update his labs tomorrow with PCP.  Patient is advised to send us  a copy for reference.  Benign hypertension Office blood pressures are very well-controlled. Amlodipine dose was reduced to 5 mg p.o. daily after starting BPH medications. Given his history of diabetes recommend being on ACE inhibitors or ARB.  Patient states that he has a yearly physical with PCP tomorrow and will discuss this further  OSA on CPAP Reemphasized importance of device compliance.   Orders Placed:  Orders Placed This Encounter  Procedures   CT CORONARY MORPH W/CTA COR W/SCORE W/CA W/CM &/OR WO/CM    Standing Status:   Future    Expected Date:   10/10/2024    Expiration Date:   09/09/2025    If indicated for the ordered procedure, I authorize the administration of contrast media per Radiology protocol:   Yes    Does the patient have a contrast media/X-ray dye allergy?:   No    [facility]    Authorization::   Per Dr. Michele for Abnormal EKG; bmi 29   EKG 12-Lead    Final Medication List:    Meds ordered this encounter  Medications   metoprolol  tartrate (LOPRESSOR ) 25 MG tablet    Sig: Take one (1) 25 mg tablet by mouth two (2) hours prior to CT scan.    Dispense:  1 tablet    Refill:  0    Medications Discontinued During This Encounter  Medication Reason   silver  sulfADIAZINE  (SILVADENE ) 1 % cream Patient Preference     Current Outpatient Medications:    alfuzosin (UROXATRAL) 10 MG 24 hr tablet, Take 10 mg by mouth daily., Disp: , Rfl:    amLODipine (NORVASC) 10 MG tablet, Take 10 mg by mouth daily. (Patient taking differently: Take  5 mg by mouth daily.), Disp: , Rfl:    aspirin  EC 81 MG tablet, Take 81 mg by mouth daily. Swallow whole., Disp: , Rfl:    azelastine (ASTELIN) 0.1 % nasal spray, Place into the nose., Disp: , Rfl:    finasteride (PROSCAR) 5 MG tablet, Take 5 mg by mouth daily., Disp: , Rfl:    meloxicam (MOBIC) 15 MG tablet, Take 15 mg by mouth daily., Disp: , Rfl:    metFORMIN (GLUCOPHAGE) 850 MG tablet, Take 850 mg by mouth in the morning and at bedtime., Disp: , Rfl:    metoprolol  tartrate (LOPRESSOR ) 25 MG tablet, Take one (1) 25 mg tablet by mouth two (2) hours prior to CT scan., Disp: 1 tablet, Rfl: 0   rosuvastatin  (CRESTOR ) 5 MG tablet, TAKE 1 TABLET BY MOUTH EVERYDAY AT BEDTIME, Disp: 90 tablet, Rfl: 0   sertraline (ZOLOFT) 50 MG tablet, Take 1 tablet by mouth daily., Disp: , Rfl:   Consent:   NA  Disposition:   1 year follow-up for now unless if coronary CT results warrant sooner evaluation.  His questions and concerns were addressed to his satisfaction. He voices understanding of the recommendations provided during this encounter.    Signed, Richard Michele HAS, [facility]  A Division of [facility] [address], [place], [place] [address]   09/09/2024 1:57 PM "

## 2024-09-09 NOTE — Patient Instructions (Addendum)
 Medication Instructions:  Your physician recommends that you continue on your current medications as directed. Please refer to the Current Medication list given to you today.  *If you need a refill on your cardiac medications before your next appointment, please call your pharmacy*  Lab Work: None ordered If you have labs (blood work) drawn today and your tests are completely normal, you will receive your results only by: MyChart Message (if you have MyChart) OR A paper copy in the mail If you have any lab test that is abnormal or we need to change your treatment, we will call you to review the results.  Testing/Procedures: Coronary CT Angiography  Follow-Up: At [facility], you and your health needs are our priority.  As part of our continuing mission to provide you with exceptional heart care, our providers are all part of one team.  This team includes your primary Cardiologist (physician) and Advanced Practice Providers or APPs (Physician Assistants and Nurse Practitioners) who all work together to provide you with the care you need, when you need it.  Your next appointment:   1 year(s)  Provider:   Madonna Large, DO    We recommend signing up for the patient portal called MyChart.  Sign up information is provided on this After Visit Summary.  MyChart is used to connect with patients for Virtual Visits (Telemedicine).  Patients are able to view lab/test results, encounter notes, upcoming appointments, etc.  Non-urgent messages can be sent to your provider as well.   To learn more about what you can do with MyChart, go to [email or web address].   Other Instructions   Your cardiac CT will be scheduled at   [facility] [address]  [place], [place] [address]  At the [facility] and [facility] at [address], please enter the parking lot using the [address] entrance and use the FREE valet service at the patient drop-off area. Enter  the building and check-in with registration on the main floor.  Please follow these instructions carefully (unless otherwise directed):  An IV will be required for this test and Nitroglycerin will be given.   On the Night Before the Test: Be sure to Drink plenty of water. Do not consume any caffeinated/decaffeinated beverages or chocolate 12 hours prior to your test. Do not take any antihistamines 12 hours prior to your test.  On the Day of the Test: Drink plenty of water until 1 hour prior to the test. Do not eat any food 1 hour prior to test. You may take your regular medications prior to the test.  Take Metoprolol  Tartrate (Lopressor ) 25 mg two hours prior to test. HOLD Amlodipine (Norvasc) on the morning of the test. Patients who wear a continuous glucose monitor MUST remove the device prior to scanning.      After the Test: Drink plenty of water. After receiving IV contrast, you may experience a mild flushed feeling. This is normal. On occasion, you may experience a mild rash up to 24 hours after the test. This is not dangerous. If this occurs, you can take Benadryl 25 mg, Zyrtec, Claritin, or Allegra and increase your fluid intake. (Patients taking Tikosyn should avoid Benadryl, and may take Zyrtec, Claritin, or Allegra) If you experience trouble breathing, this can be serious. If it is severe call 911 IMMEDIATELY. If it is mild, please call our office.  We will call to schedule your test 2-4 weeks out understanding that some insurance companies will need an  authorization prior to the service being performed.   For more information and frequently asked questions, please visit our website : [email or web address]  For non-scheduling related questions, please contact the cardiac imaging nurse navigator should you have any questions/concerns: Cardiac Imaging Nurse Navigators Direct Office Dial: [phone]   For scheduling needs, including cancellations and  rescheduling, please call Brittany, [phone].

## 2024-09-17 ENCOUNTER — Encounter (HOSPITAL_COMMUNITY): Payer: Self-pay

## 2024-09-19 ENCOUNTER — Ambulatory Visit (HOSPITAL_COMMUNITY)
Admission: RE | Admit: 2024-09-19 | Discharge: 2024-09-19 | Disposition: A | Source: Ambulatory Visit | Attending: Cardiovascular Disease | Admitting: Cardiovascular Disease

## 2024-09-19 DIAGNOSIS — R9431 Abnormal electrocardiogram [ECG] [EKG]: Secondary | ICD-10-CM | POA: Insufficient documentation

## 2024-09-19 LAB — POCT I-STAT CREATININE: Creatinine, Ser: 1.2 mg/dL (ref 0.61–1.24)

## 2024-09-19 MED ORDER — IOHEXOL 350 MG/ML SOLN
100.0000 mL | Freq: Once | INTRAVENOUS | Status: AC | PRN
  Administered 2024-09-19: 100 mL via INTRAVENOUS

## 2024-09-19 MED ORDER — NITROGLYCERIN 0.4 MG SL SUBL
0.8000 mg | SUBLINGUAL_TABLET | Freq: Once | SUBLINGUAL | Status: AC
  Administered 2024-09-19: 0.8 mg via SUBLINGUAL

## 2024-09-22 ENCOUNTER — Ambulatory Visit: Payer: Self-pay | Admitting: Cardiology

## 2024-09-22 NOTE — Telephone Encounter (Signed)
 Please review and advise further.

## 2024-09-24 NOTE — Telephone Encounter (Signed)
 Covering for Dr. Tyree inbox. Dr. Tolia will be back in two weeks. Outside blood work reviewed, blood work obtained at [facility] on 09/19/2024 showed a total cholesterol 124, triglyceride 65, HDL 57, LDL 53.  Cholesterol has shown excellent control.  Recommend continue on the current therapy. Richard Lindsey
